# Patient Record
Sex: Male | Born: 1970 | Hispanic: No | Marital: Married | Smoking: Former smoker
Health system: Southern US, Community
[De-identification: ages and names within clinical notes are randomized; demographics above are authoritative.]

## PROBLEM LIST (undated history)

## (undated) DIAGNOSIS — Z21 Asymptomatic human immunodeficiency virus [HIV] infection status: Secondary | ICD-10-CM

## (undated) DIAGNOSIS — T7840XA Allergy, unspecified, initial encounter: Secondary | ICD-10-CM

## (undated) DIAGNOSIS — B20 Human immunodeficiency virus [HIV] disease: Secondary | ICD-10-CM

## (undated) HISTORY — DX: Human immunodeficiency virus (HIV) disease: B20

## (undated) HISTORY — DX: Allergy, unspecified, initial encounter: T78.40XA

## (undated) HISTORY — DX: Asymptomatic human immunodeficiency virus (hiv) infection status: Z21

---

## 2012-01-16 LAB — T-HELPER CELL (CD4) - (RCID CLINIC ONLY): CD4 absolute: 773

## 2012-01-18 LAB — CBC AND DIFFERENTIAL
HCT: 45 % (ref 41–53)
Platelets: 234 10*3/uL (ref 150–399)
WBC: 7 10^3/mL

## 2012-01-18 LAB — HEPATIC FUNCTION PANEL: ALT: 10 U/L (ref 10–40)

## 2012-01-18 LAB — BASIC METABOLIC PANEL
Creatinine: 0.8 mg/dL (ref 0.6–1.3)
Potassium: 4.1 mmol/L (ref 3.4–5.3)
Sodium: 139 mmol/L (ref 137–147)

## 2012-06-25 ENCOUNTER — Ambulatory Visit (INDEPENDENT_AMBULATORY_CARE_PROVIDER_SITE_OTHER): Payer: Medicaid Other

## 2012-06-25 ENCOUNTER — Other Ambulatory Visit (HOSPITAL_COMMUNITY)
Admission: RE | Admit: 2012-06-25 | Discharge: 2012-06-25 | Disposition: A | Discharge status: Home / Self Care | Payer: Medicaid Other | Source: Ambulatory Visit | Attending: Internal Medicine | Admitting: Internal Medicine

## 2012-06-25 DIAGNOSIS — B20 Human immunodeficiency virus [HIV] disease: Secondary | ICD-10-CM

## 2012-06-25 DIAGNOSIS — Z113 Encounter for screening for infections with a predominantly sexual mode of transmission: Secondary | ICD-10-CM | POA: Insufficient documentation

## 2012-06-25 DIAGNOSIS — F329 Major depressive disorder, single episode, unspecified: Secondary | ICD-10-CM

## 2012-06-25 DIAGNOSIS — N529 Male erectile dysfunction, unspecified: Secondary | ICD-10-CM | POA: Insufficient documentation

## 2012-06-25 DIAGNOSIS — R197 Diarrhea, unspecified: Secondary | ICD-10-CM

## 2012-06-25 DIAGNOSIS — F341 Dysthymic disorder: Secondary | ICD-10-CM

## 2012-06-25 LAB — CBC WITH DIFFERENTIAL/PLATELET
Basophils Absolute: 0 10*3/uL (ref 0.0–0.1)
HCT: 41.1 % (ref 39.0–52.0)
Lymphocytes Relative: 41 % (ref 12–46)
Monocytes Absolute: 0.6 10*3/uL (ref 0.1–1.0)
Neutro Abs: 4.4 10*3/uL (ref 1.7–7.7)
Neutrophils Relative %: 51 % (ref 43–77)
Platelets: 252 10*3/uL (ref 150–400)
RDW: 15 % (ref 11.5–15.5)
WBC: 8.6 10*3/uL (ref 4.0–10.5)

## 2012-06-26 LAB — COMPLETE METABOLIC PANEL WITH GFR
ALT: 9 U/L (ref 0–53)
AST: 12 U/L (ref 0–37)
Albumin: 4.2 g/dL (ref 3.5–5.2)
Alkaline Phosphatase: 107 U/L (ref 39–117)
Calcium: 9.5 mg/dL (ref 8.4–10.5)
Chloride: 108 mEq/L (ref 96–112)
Potassium: 4.2 mEq/L (ref 3.5–5.3)
Sodium: 143 mEq/L (ref 135–145)

## 2012-06-26 LAB — URINALYSIS
Nitrite: NEGATIVE
Protein, ur: NEGATIVE mg/dL
Specific Gravity, Urine: 1.028 (ref 1.005–1.030)
Urobilinogen, UA: 0.2 mg/dL (ref 0.0–1.0)

## 2012-06-26 LAB — LIPID PANEL
Cholesterol: 148 mg/dL (ref 0–200)
LDL Cholesterol: 91 mg/dL (ref 0–99)
Triglycerides: 63 mg/dL (ref <150)
VLDL: 13 mg/dL (ref 0–40)

## 2012-06-26 LAB — HEPATITIS C ANTIBODY: HCV Ab: NEGATIVE

## 2012-06-26 LAB — HEPATITIS B CORE ANTIBODY, TOTAL: Hep B Core Total Ab: POSITIVE — AB

## 2012-06-26 LAB — T-HELPER CELL (CD4) - (RCID CLINIC ONLY): CD4 % Helper T Cell: 33 % (ref 33–55)

## 2012-06-26 LAB — HEPATITIS A ANTIBODY, TOTAL: Hep A Total Ab: POSITIVE — AB

## 2012-07-04 NOTE — Progress Notes (Signed)
Pt reports unintentional weight loss of alt least 10 labs in the last 1-2 months. He gives history of continuous diarrhea. Some stools are total liquid and some are semi formed with a fluff consistency. He reports stool leakage throughout the day.  He is very concerned and anxious about these symptoms. He states having reported this to his previous ID physician and does not fel his issue was taken seriously.  His appetite is decreased and he is asking for ensure script.   Pt also experiencing erectile dysfunction.

## 2012-07-05 LAB — TB SKIN TEST
Induration: 0 mm
TB Skin Test: NEGATIVE

## 2012-07-10 ENCOUNTER — Ambulatory Visit (INDEPENDENT_AMBULATORY_CARE_PROVIDER_SITE_OTHER): Payer: Medicaid Other | Admitting: Internal Medicine

## 2012-07-10 ENCOUNTER — Encounter: Payer: Self-pay | Admitting: Internal Medicine

## 2012-07-10 VITALS — BP 106/74 | HR 72 | Temp 98.2°F | Wt 155.0 lb

## 2012-07-10 DIAGNOSIS — K529 Noninfective gastroenteritis and colitis, unspecified: Secondary | ICD-10-CM

## 2012-07-10 DIAGNOSIS — B20 Human immunodeficiency virus [HIV] disease: Secondary | ICD-10-CM

## 2012-07-10 DIAGNOSIS — R197 Diarrhea, unspecified: Secondary | ICD-10-CM

## 2012-07-10 DIAGNOSIS — Z21 Asymptomatic human immunodeficiency virus [HIV] infection status: Secondary | ICD-10-CM

## 2012-07-10 DIAGNOSIS — E46 Unspecified protein-calorie malnutrition: Secondary | ICD-10-CM

## 2012-07-10 DIAGNOSIS — E440 Moderate protein-calorie malnutrition: Secondary | ICD-10-CM

## 2012-07-10 MED ORDER — ENSURE PO LIQD: 237.0000 mL | Freq: Three times a day (TID) | ORAL | Status: DC
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
## 2012-07-11 ENCOUNTER — Other Ambulatory Visit: Payer: Medicaid Other

## 2012-07-15 DIAGNOSIS — B20 Human immunodeficiency virus [HIV] disease: Secondary | ICD-10-CM | POA: Insufficient documentation

## 2012-07-15 DIAGNOSIS — E46 Unspecified protein-calorie malnutrition: Secondary | ICD-10-CM | POA: Insufficient documentation

## 2012-07-15 LAB — STOOL CULTURE

## 2012-07-16 LAB — CLOSTRIDIUM DIFFICILE BY PCR: Toxigenic C. Difficile by PCR: NOT DETECTED

## 2012-07-24 ENCOUNTER — Ambulatory Visit (INDEPENDENT_AMBULATORY_CARE_PROVIDER_SITE_OTHER): Payer: Self-pay | Admitting: Internal Medicine

## 2012-07-24 ENCOUNTER — Encounter: Payer: Self-pay | Admitting: Internal Medicine

## 2012-07-24 VITALS — BP 111/73 | HR 72 | Temp 98.4°F | Ht 74.0 in | Wt 159.0 lb

## 2012-07-24 DIAGNOSIS — Z23 Encounter for immunization: Secondary | ICD-10-CM

## 2012-07-24 DIAGNOSIS — E46 Unspecified protein-calorie malnutrition: Secondary | ICD-10-CM

## 2012-07-24 DIAGNOSIS — Z21 Asymptomatic human immunodeficiency virus [HIV] infection status: Secondary | ICD-10-CM

## 2012-07-24 DIAGNOSIS — B20 Human immunodeficiency virus [HIV] disease: Secondary | ICD-10-CM

## 2012-07-24 MED ORDER — DRONABINOL 10 MG PO CAPS: 10.0000 mg | ORAL_CAPSULE | Freq: Two times a day (BID) | ORAL | Status: DC

## 2012-07-24 NOTE — Progress Notes (Signed)
HIV CLINIC NOTE  RFV: follow up appt Subjective:    Patient ID: Tyler Leonard, male    DOB: Apr 03, 1971, 41 y.o.   MRN: [id number]  HPI 41yo Male with CD 4 count 1080, VL < 20, on atripla, maintains great adherence. No missing doses. Comes to clinic to evaluate diarrhea and weight loss. Bowel movements now just twice a day . Eating snacks most of the day and eats on 1-2 meals per day. Drinks had 3 cans of ensure a day but ran out quickly since he only gets 1 case per week QOW[facility] from [facility]. 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 Current Outpatient Prescriptions on File Prior to Visit  Medication Sig Dispense Refill  . efavirenz-emtricitabine-tenofovir (ATRIPLA) 600-200-300 MG per tablet Take 1 tablet by mouth at bedtime.      . ENSURE (ENSURE) Take 237 mLs by mouth 3 (three) times daily between meals.  237 mL  11   Active Ambulatory Problems    Diagnosis Date Noted  . Diarrhea 06/25/2012  . Erectile dysfunction 06/25/2012  . HIV (human immunodeficiency virus infection) 07/15/2012  . Malnutrition 07/15/2012   Resolved Ambulatory Problems    Diagnosis Date Noted  . No Resolved Ambulatory Problems   Past Medical History  Diagnosis Date  . Allergy      Review of Systems  Constitutional:positive for weight loss. Negative for fever, chills, diaphoresis, activity change, appetite change, fatigue HENT: Negative for congestion, sore throat, rhinorrhea, sneezing, trouble swallowing and sinus pressure.  Eyes: Negative for photophobia and visual disturbance.  Respiratory: Negative for cough, chest tightness, shortness of breath, wheezing and stridor.  Cardiovascular: Negative for chest pain, palpitations and leg swelling.  Gastrointestinal: diarrhea. Negative for nausea, vomiting, abdominal pain,  constipation, blood in stool, abdominal distention and anal bleeding.  Genitourinary: Negative for dysuria, hematuria, flank  pain and difficulty urinating.  Musculoskeletal: Negative for myalgias, back pain, joint swelling, arthralgias and gait problem.  Skin: Negative for color change, pallor, rash and wound.  Neurological: Negative for dizziness, tremors, weakness and light-headedness.  Hematological: Negative for adenopathy. Does not bruise/bleed easily.  Psychiatric/Behavioral: Negative for behavioral problems, confusion, sleep disturbance, dysphoric mood, decreased concentration and agitation.       Objective:   Physical Exam  BP 111/73  Pulse 72  Temp 98.4 F (36.9 C) (Oral)  Ht 6\' 2"  (1.88 m)  Wt 159 lb (72.122 kg)  BMI 20.41 kg/m2 Physical Exam  Constitutional: He is oriented to person, place, and time. He appears well-developed and well-nourished. No distress.  HENT:  Mouth/Throat: Oropharynx is clear and moist. No oropharyngeal exudate.  Cardiovascular: Normal rate, regular rhythm and normal heart sounds. Exam reveals no gallop and no friction rub.  No murmur heard.  Pulmonary/Chest: Effort normal and breath sounds normal. No respiratory distress. He has no wheezes.  Abdominal: Soft. Bowel sounds are normal. He exhibits no distension. There is no tenderness.  Lymphadenopathy:  He has no cervical adenopathy.  Neurological: He is alert and oriented to person, place, and time.  Skin: Skin is warm and dry. No rash noted. No erythema.  Psychiatric: He has a normal mood and affect. His behavior is normal.         Assessment & Plan:  Diarrhea = seems ok since on 1-2 BM per day. Will do PRN immodium  Malnutrition = weight loss, not a baseline, but he has gained 4 lb since last visit, getting nutritional supplements, plus  we will add appetit stimulant = to help to increase weight gain. Will try marino 10mg  BID for now.  Health maintenance =received flu vax. He is already Immunized ag. hav and hbv  rtc 4-6 wks

## 2012-08-29 ENCOUNTER — Ambulatory Visit (INDEPENDENT_AMBULATORY_CARE_PROVIDER_SITE_OTHER): Payer: Self-pay | Admitting: Internal Medicine

## 2012-08-29 ENCOUNTER — Encounter: Payer: Self-pay | Admitting: Internal Medicine

## 2012-08-29 VITALS — BP 113/70 | HR 80 | Temp 97.7°F | Wt 161.0 lb

## 2012-08-29 DIAGNOSIS — N529 Male erectile dysfunction, unspecified: Secondary | ICD-10-CM

## 2012-08-29 MED ORDER — SILDENAFIL CITRATE 100 MG PO TABS: 100.0000 mg | ORAL_TABLET | Freq: Every day | ORAL | Status: DC | PRN
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
## 2012-11-19 ENCOUNTER — Other Ambulatory Visit: Payer: Self-pay | Admitting: *Deleted

## 2012-11-19 DIAGNOSIS — N529 Male erectile dysfunction, unspecified: Secondary | ICD-10-CM

## 2012-11-19 DIAGNOSIS — F329 Major depressive disorder, single episode, unspecified: Secondary | ICD-10-CM

## 2012-11-19 DIAGNOSIS — B20 Human immunodeficiency virus [HIV] disease: Secondary | ICD-10-CM

## 2012-11-19 DIAGNOSIS — R197 Diarrhea, unspecified: Secondary | ICD-10-CM

## 2012-11-19 MED ORDER — EFAVIRENZ-EMTRICITAB-TENOFOVIR 600-200-300 MG PO TABS: 1.0000 | ORAL_TABLET | Freq: Every day | ORAL | Status: DC

## 2012-11-26 ENCOUNTER — Other Ambulatory Visit: Payer: Self-pay | Admitting: *Deleted

## 2012-11-26 DIAGNOSIS — N529 Male erectile dysfunction, unspecified: Secondary | ICD-10-CM

## 2012-11-26 DIAGNOSIS — R197 Diarrhea, unspecified: Secondary | ICD-10-CM

## 2012-11-26 DIAGNOSIS — F419 Anxiety disorder, unspecified: Secondary | ICD-10-CM

## 2012-11-26 DIAGNOSIS — B20 Human immunodeficiency virus [HIV] disease: Secondary | ICD-10-CM

## 2012-11-26 MED ORDER — EFAVIRENZ-EMTRICITAB-TENOFOVIR 600-200-300 MG PO TABS: 1.0000 | ORAL_TABLET | Freq: Every day | ORAL | Status: DC

## 2012-11-28 ENCOUNTER — Other Ambulatory Visit (INDEPENDENT_AMBULATORY_CARE_PROVIDER_SITE_OTHER): Payer: Self-pay

## 2012-11-28 DIAGNOSIS — B20 Human immunodeficiency virus [HIV] disease: Secondary | ICD-10-CM

## 2012-11-28 LAB — CBC WITH DIFFERENTIAL/PLATELET
Eosinophils Absolute: 0.1 10*3/uL (ref 0.0–0.7)
Eosinophils Relative: 1 % (ref 0–5)
Hemoglobin: 13.5 g/dL (ref 13.0–17.0)
Lymphs Abs: 2.8 10*3/uL (ref 0.7–4.0)
MCH: 28.9 pg (ref 26.0–34.0)
MCV: 84.6 fL (ref 78.0–100.0)
Monocytes Absolute: 0.4 10*3/uL (ref 0.1–1.0)
Monocytes Relative: 7 % (ref 3–12)
RBC: 4.67 MIL/uL (ref 4.22–5.81)

## 2012-11-28 LAB — COMPLETE METABOLIC PANEL WITH GFR
CO2: 26 mEq/L (ref 19–32)
Creat: 0.73 mg/dL (ref 0.50–1.35)
GFR, Est African American: >89 mL/min
GFR, Est Non African American: >89 mL/min
Glucose, Bld: 72 mg/dL (ref 70–99)
Total Bilirubin: 0.3 mg/dL (ref 0.3–1.2)
Total Protein: 6.4 g/dL (ref 6.0–8.3)

## 2012-12-12 ENCOUNTER — Ambulatory Visit (INDEPENDENT_AMBULATORY_CARE_PROVIDER_SITE_OTHER): Payer: Self-pay | Admitting: Internal Medicine

## 2012-12-12 ENCOUNTER — Encounter: Payer: Self-pay | Admitting: Internal Medicine

## 2012-12-12 VITALS — BP 116/69 | HR 72 | Temp 98.0°F | Wt 174.0 lb

## 2012-12-12 DIAGNOSIS — Z21 Asymptomatic human immunodeficiency virus [HIV] infection status: Secondary | ICD-10-CM

## 2012-12-12 DIAGNOSIS — E440 Moderate protein-calorie malnutrition: Secondary | ICD-10-CM

## 2012-12-12 DIAGNOSIS — B20 Human immunodeficiency virus [HIV] disease: Secondary | ICD-10-CM

## 2012-12-12 MED ORDER — ELVITEG-COBIC-EMTRICIT-TENOFDF 150-150-200-300 MG PO TABS: 1.0000 | ORAL_TABLET | Freq: Every day | ORAL | Status: DC

## 2012-12-12 MED ORDER — ENSURE PO LIQD: 1.0000 | Freq: Two times a day (BID) | ORAL | Status: AC

## 2012-12-12 NOTE — Progress Notes (Signed)
[facility] HIV CLINIC NOTE  RFV: routine follow up Subjective:    Patient ID: Tyler Leonard, male    DOB: April 22, 1971, 42 y.o.   MRN: [id number]  HPI Tyler Leonard is a 42yo Male, MSM, with HIV disease, CD 4 count of 810/VL<20,on atripla, doing well. Still concerned about facial wasting and weight loss, although he has gained 14# since his last visit with use of ensure and marinol. He is having some difficulty with nasal congestion vs. Teeth pain to right frontal area.   He has quiet smoking. He reports having flu like symptoms, but not receiving oseltamivir, did not seek care just did over the counter meds.  Started seeing psychiatrist risperidone 1mg  at bedtime and prazosin 1mg  at bedtime. Has not been sleeping well, feeling fatigue.   Current Outpatient Prescriptions on File Prior to Visit  Medication Sig Dispense Refill  . dronabinol (MARINOL) 10 MG capsule Take 1 capsule (10 mg total) by mouth 2 (two) times daily before a meal.  60 capsule  5  . efavirenz-emtricitabine-tenofovir (ATRIPLA) 600-200-300 MG per tablet Take 1 tablet by mouth at bedtime.  30 tablet  11  . sildenafil (VIAGRA) 100 MG tablet Take 1 tablet (100 mg total) by mouth daily as needed for erectile dysfunction.  10 tablet  6   Active Ambulatory Problems    Diagnosis Date Noted  . Diarrhea 06/25/2012  . Erectile dysfunction 06/25/2012  . HIV (human immunodeficiency virus infection) 07/15/2012  . Malnutrition 07/15/2012   Resolved Ambulatory Problems    Diagnosis Date Noted  . No Resolved Ambulatory Problems   Past Medical History  Diagnosis Date  . Allergy    Social hx: started in a relationship     Review of Systems     Objective:   Physical Exam BP 116/69  Pulse 72  Temp 98 F (36.7 C) (Oral)  Wt 174 lb (78.926 kg) ( up by 14# since last visit) Physical Exam  Constitutional: He is oriented to person, place, and time. He appears well-developed and well-nourished. No distress.  HENT:  Mouth/Throat: Oropharynx  is clear and moist. No oropharyngeal exudate.  Cardiovascular: Normal rate, regular rhythm and normal heart sounds. Exam reveals no gallop and no friction rub.  No murmur heard.  Pulmonary/Chest: Effort normal and breath sounds normal. No respiratory distress. He has no wheezes.  Abdominal: Soft. Bowel sounds are normal. He exhibits no distension. There is no tenderness.  Lymphadenopathy:  He has no cervical adenopathy.  Neurological: He is alert and oriented to person, place, and time.  Skin: Skin is warm and dry. No rash noted. No erythema.  Psychiatric: He has a normal mood and affect. His behavior is normal.       Assessment & Plan:  HIV = will switch to stribild due to sleep disturbances and fatigue.  Health maintenance = will do pneumovax at next visit in 3 months  Erectile dysfunction = recommend to decrease usage to 50mg  instead of 100mg  due to drug interaction with stribild, increasing levels.  rtc in 3 months for routine visit

## 2013-02-11 ENCOUNTER — Encounter: Payer: Self-pay | Admitting: *Deleted

## 2013-02-25 ENCOUNTER — Other Ambulatory Visit: Payer: Self-pay | Admitting: Infectious Diseases

## 2013-02-25 ENCOUNTER — Other Ambulatory Visit (INDEPENDENT_AMBULATORY_CARE_PROVIDER_SITE_OTHER): Payer: Self-pay

## 2013-02-25 DIAGNOSIS — B20 Human immunodeficiency virus [HIV] disease: Secondary | ICD-10-CM

## 2013-02-25 LAB — COMPREHENSIVE METABOLIC PANEL
ALT: 11 U/L (ref 0–53)
AST: 14 U/L (ref 0–37)
Albumin: 4.3 g/dL (ref 3.5–5.2)
Alkaline Phosphatase: 67 U/L (ref 39–117)
BUN: 15 mg/dL (ref 6–23)
Potassium: 4.2 mEq/L (ref 3.5–5.3)
Sodium: 139 mEq/L (ref 135–145)

## 2013-02-25 LAB — CBC WITH DIFFERENTIAL/PLATELET
Basophils Absolute: 0 10*3/uL (ref 0.0–0.1)
Basophils Relative: 0 % (ref 0–1)
MCHC: 32.7 g/dL (ref 30.0–36.0)
Monocytes Absolute: 0.5 10*3/uL (ref 0.1–1.0)
Neutro Abs: 2.6 10*3/uL (ref 1.7–7.7)
Neutrophils Relative %: 42 % — ABNORMAL LOW (ref 43–77)
Platelets: 223 10*3/uL (ref 150–400)
RDW: 13.7 % (ref 11.5–15.5)
WBC: 6.2 10*3/uL (ref 4.0–10.5)

## 2013-02-26 LAB — T-HELPER CELL (CD4) - (RCID CLINIC ONLY): CD4 T Cell Abs: 750 uL (ref 400–2700)

## 2013-03-11 ENCOUNTER — Ambulatory Visit (INDEPENDENT_AMBULATORY_CARE_PROVIDER_SITE_OTHER): Payer: Self-pay | Admitting: Internal Medicine

## 2013-03-11 ENCOUNTER — Encounter: Payer: Self-pay | Admitting: Internal Medicine

## 2013-03-11 VITALS — BP 107/72 | HR 78 | Temp 97.6°F | Wt 181.0 lb

## 2013-03-11 DIAGNOSIS — N529 Male erectile dysfunction, unspecified: Secondary | ICD-10-CM

## 2013-03-11 MED ORDER — TADALAFIL 10 MG PO TABS: 10.0000 mg | ORAL_TABLET | Freq: Every day | ORAL | Status: DC | PRN

## 2013-03-11 NOTE — Progress Notes (Signed)
[facility] HIV CLINIC NOTE  RFV: routine Subjective:    Patient ID: Tyler Leonard, male    DOB: Jul 21, 1971, 42 y.o.   MRN: [id number]  HPI CD 4 count of 750/VL < 20, on stribild, doing well.not missing doses.  Having difficulty to maintain erection. Has tried up to 100mg  viagra.   Also taking prazosin 2mg  daily Risperidone 3mg  dialy  Current Outpatient Prescriptions on File Prior to Visit  Medication Sig Dispense Refill  . elvitegravir-cobicistat-emtricitabine-tenofovir (STRIBILD) 150-150-200-300 MG TABS Take 1 tablet by mouth daily with breakfast.  30 tablet  11  . ENSURE (ENSURE) Take 1 Can by mouth 2 (two) times daily between meals.  237 mL  11  . sildenafil (VIAGRA) 100 MG tablet Take 1 tablet (100 mg total) by mouth daily as needed for erectile dysfunction.  10 tablet  6  . dronabinol (MARINOL) 10 MG capsule Take 1 capsule (10 mg total) by mouth 2 (two) times daily before a meal.  60 capsule  5   No current facility-administered medications on file prior to visit.   Active Ambulatory Problems    Diagnosis Date Noted  . Diarrhea 06/25/2012  . Erectile dysfunction 06/25/2012  . HIV (human immunodeficiency virus infection) 07/15/2012  . Malnutrition 07/15/2012   Resolved Ambulatory Problems    Diagnosis Date Noted  . No Resolved Ambulatory Problems   Past Medical History  Diagnosis Date  . Allergy        Review of Systems 10 point of review of system negative except for hpi    Objective:   Physical Exam BP 107/72  Pulse 78  Temp(Src) 97.6 F (36.4 C) (Oral)  Wt 181 lb (82.101 kg)  BMI 23.23 kg/m2 Physical Exam  Constitutional: He is oriented to person, place, and time. He appears well-developed and well-nourished. No distress.  HENT:  Mouth/Throat: Oropharynx is clear and moist. No oropharyngeal exudate.  Cardiovascular: Normal rate, regular rhythm and normal heart sounds. Exam reveals no gallop and no friction rub.  No murmur heard.  Pulmonary/Chest: Effort  normal and breath sounds normal. No respiratory distress. He has no wheezes.  Abdominal: Soft. Bowel sounds are normal. He exhibits no distension. There is no tenderness.  Lymphadenopathy:  He has no cervical adenopathy.  Neurological: He is alert and oriented to person, place, and time.  Skin: Skin is warm and dry. No rash noted. No erythema.  Psychiatric: He has a normal mood and affect. His behavior is normal.       Assessment & Plan:  HIV = continue with hiv meds  Depression = continue with meds per psychiatrist. Discuss that psych meds may also be contributing to lack of sexual drive  Erectile dysfunction = can do trial of cialis but may not have any difference

## 2013-04-10 NOTE — Progress Notes (Signed)
Patient ID: Tyler Leonard, male   DOB: 12/11/1970, 42 y.o.   MRN: [id number] [facility] CM: Neila Gear

## 2013-05-15 NOTE — Progress Notes (Signed)
Patient ID: Tyler Leonard, male   DOB: Apr 04, 1971, 42 y.o.   MRN: [id number] [facility] CM discharged. Client moved to [place].

## 2013-06-20 ENCOUNTER — Encounter: Payer: Self-pay | Admitting: *Deleted

## 2013-06-20 NOTE — Progress Notes (Signed)
Patient ID: Tyler Leonard, male   DOB: September 06, 1971, 42 y.o.   MRN: [id number] Relocated to [facility].

## 2013-07-14 ENCOUNTER — Ambulatory Visit: Payer: Self-pay | Admitting: Internal Medicine

## 2013-09-11 ENCOUNTER — Other Ambulatory Visit: Payer: Self-pay

## 2013-09-22 ENCOUNTER — Other Ambulatory Visit (INDEPENDENT_AMBULATORY_CARE_PROVIDER_SITE_OTHER): Payer: Self-pay

## 2013-09-22 DIAGNOSIS — B20 Human immunodeficiency virus [HIV] disease: Secondary | ICD-10-CM

## 2013-09-22 LAB — CBC WITH DIFFERENTIAL/PLATELET
Basophils Relative: 0 % (ref 0–1)
Eosinophils Absolute: 0 10*3/uL (ref 0.0–0.7)
HCT: 46.3 % (ref 39.0–52.0)
Hemoglobin: 15.8 g/dL (ref 13.0–17.0)
Lymphs Abs: 2.3 10*3/uL (ref 0.7–4.0)
MCH: 27.7 pg (ref 26.0–34.0)
MCHC: 34.1 g/dL (ref 30.0–36.0)
Monocytes Absolute: 0.4 10*3/uL (ref 0.1–1.0)
Monocytes Relative: 5 % (ref 3–12)
Neutro Abs: 4 10*3/uL (ref 1.7–7.7)
RBC: 5.7 MIL/uL (ref 4.22–5.81)

## 2013-09-22 LAB — COMPREHENSIVE METABOLIC PANEL
ALT: <8 U/L (ref 0–53)
AST: 17 U/L (ref 0–37)
Albumin: 4.7 g/dL (ref 3.5–5.2)
CO2: 30 mEq/L (ref 19–32)
Calcium: 10 mg/dL (ref 8.4–10.5)
Chloride: 98 mEq/L (ref 96–112)
Creat: 1.12 mg/dL (ref 0.50–1.35)
Potassium: 3.9 mEq/L (ref 3.5–5.3)

## 2013-09-22 NOTE — Addendum Note (Signed)
Addended bySteva Colder on: 09/22/2013 04:07 PM   Modules accepted: Orders

## 2013-09-23 LAB — T-HELPER CELL (CD4) - (RCID CLINIC ONLY): CD4 T Cell Abs: 630 /uL (ref 400–2700)

## 2013-09-24 LAB — HIV-1 RNA QUANT-NO REFLEX-BLD: HIV-1 RNA Quant, Log: <1.3 {Log} (ref <1.30)

## 2013-10-09 ENCOUNTER — Ambulatory Visit (INDEPENDENT_AMBULATORY_CARE_PROVIDER_SITE_OTHER): Payer: Self-pay | Admitting: Internal Medicine

## 2013-10-09 ENCOUNTER — Encounter: Payer: Self-pay | Admitting: Internal Medicine

## 2013-10-09 VITALS — BP 111/66 | HR 71 | Temp 97.8°F | Wt 168.0 lb

## 2013-10-09 DIAGNOSIS — B20 Human immunodeficiency virus [HIV] disease: Secondary | ICD-10-CM

## 2013-10-09 DIAGNOSIS — Z21 Asymptomatic human immunodeficiency virus [HIV] infection status: Secondary | ICD-10-CM

## 2013-10-09 MED ORDER — ELVITEG-COBIC-EMTRICIT-TENOFDF 150-150-200-300 MG PO TABS: 1.0000 | ORAL_TABLET | Freq: Every day | ORAL | Status: DC

## 2013-10-09 MED ORDER — ANDROGEL PUMP 20.25 MG/ACT (1.62%) TD GEL: 40.5000 mg | Freq: Two times a day (BID) | TRANSDERMAL | Status: DC

## 2013-10-09 NOTE — Progress Notes (Signed)
Subjective:    Patient ID: Tyler Leonard, male    DOB: 03/07/1971, 42 y.o.   MRN: [id number]  HPI  42 yo M with HIV, CD 4 count 630/VL<20, on stribild. continues having good adherence. He tried going to clinic in [place] for  Follow up but now decided to come back to [facility]. He was last seen in May. He is here with his significant other. Still having erectile dysfunction but improved with testoterone supplementation. He notices increased in mood, less fatigue, less hot flashes due To starting androgel since mid august. He is using it more than directed 2 pumps BID rather 1 pump daily since no difference from taking it for  One month.   Labs from [facility]: Free t 2.8 and total T 220 . tsh 1.04. In [place] in mid July Cd 4 count 1000s  No Known Allergies   Current Outpatient Prescriptions on File Prior to Visit  Medication Sig Dispense Refill  . elvitegravir-cobicistat-emtricitabine-tenofovir (STRIBILD) 150-150-200-300 MG TABS Take 1 tablet by mouth daily with breakfast.  30 tablet  11  . dronabinol (MARINOL) 10 MG capsule Take 1 capsule (10 mg total) by mouth 2 (two) times daily before a meal.  60 capsule  5  . ENSURE (ENSURE) Take 1 Can by mouth 2 (two) times daily between meals.  237 mL  11  . tadalafil (CIALIS) 10 MG tablet Take 1 tablet (10 mg total) by mouth daily as needed for erectile dysfunction.  20 tablet  0   No current facility-administered medications on file prior to visit.   Active Ambulatory Problems    Diagnosis Date Noted  . Diarrhea 06/25/2012  . Erectile dysfunction 06/25/2012  . HIV (human immunodeficiency virus infection) 07/15/2012  . Malnutrition 07/15/2012   Resolved Ambulatory Problems    Diagnosis Date Noted  . No Resolved Ambulatory Problems   Past Medical History  Diagnosis Date  . Allergy    Soc hx: relocating back to [place] to be back with family's   Review of Systems  Constitutional: Negative for fever, chills, diaphoresis,  activity change, appetite change, fatigue and unexpected weight change.  HENT: Negative for congestion, sore throat, rhinorrhea, sneezing, trouble swallowing and sinus pressure.  Eyes: Negative for photophobia and visual disturbance.  Respiratory: Negative for cough, chest tightness, shortness of breath, wheezing and stridor.  Cardiovascular: Negative for chest pain, palpitations and leg swelling.  Gastrointestinal: Negative for nausea, vomiting, abdominal pain, diarrhea, constipation, blood in stool, abdominal distention and anal bleeding.  Genitourinary: Negative for dysuria, hematuria, flank pain and difficulty urinating.  Musculoskeletal: Negative for myalgias, back pain, joint swelling, arthralgias and gait problem.  Skin: Negative for color change, pallor, rash and wound.  Neurological: Negative for dizziness, tremors, weakness and light-headedness.  Hematological: Negative for adenopathy. Does not bruise/bleed easily.  Psychiatric/Behavioral: Negative for behavioral problems, confusion, sleep disturbance, dysphoric mood, decreased concentration and agitation.       Objective:   Physical Exam BP 111/66  Pulse 71  Temp(Src) 97.8 F (36.6 C) (Oral)  Wt 168 lb (76.204 kg) Constitutional: He is oriented to person, place, and time. He appears well-developed and well-nourished. No distress.  HENT:  Mouth/Throat: Oropharynx is clear and moist. No oropharyngeal exudate.  Cardiovascular: Normal rate, regular rhythm and normal heart sounds. Exam reveals no gallop and no friction rub.  No murmur heard.  Pulmonary/Chest: Effort normal and breath sounds normal. No respiratory distress. He has no wheezes.  Abdominal: Soft. Bowel sounds are normal. He exhibits no distension.  There is no tenderness.  Lymphadenopathy:  He has no cervical adenopathy.  Neurological: He is alert and oriented to person, place, and time.  Skin: Skin is warm and dry. No rash noted. No erythema.  Psychiatric: He has a  normal mood and affect. His behavior is normal.          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 Will anticipate to decrease to 1 pump BID at next visit to See if he notices any difference on lower dose.   Health maintenance = gave flu today  rtc in 3 months, labs 2 wks prior

## 2013-11-19 ENCOUNTER — Encounter: Payer: Self-pay | Admitting: *Deleted

## 2013-12-25 ENCOUNTER — Other Ambulatory Visit (INDEPENDENT_AMBULATORY_CARE_PROVIDER_SITE_OTHER): Payer: Self-pay

## 2013-12-25 DIAGNOSIS — Z79899 Other long term (current) drug therapy: Secondary | ICD-10-CM

## 2013-12-25 DIAGNOSIS — Z113 Encounter for screening for infections with a predominantly sexual mode of transmission: Secondary | ICD-10-CM

## 2013-12-25 DIAGNOSIS — B20 Human immunodeficiency virus [HIV] disease: Secondary | ICD-10-CM

## 2013-12-25 LAB — COMPLETE METABOLIC PANEL WITH GFR
ALBUMIN: 4.3 g/dL (ref 3.5–5.2)
ALT: <8 U/L (ref 0–53)
AST: 12 U/L (ref 0–37)
Alkaline Phosphatase: 80 U/L (ref 39–117)
BILIRUBIN TOTAL: 0.3 mg/dL (ref 0.2–1.2)
BUN: 14 mg/dL (ref 6–23)
CO2: 28 mEq/L (ref 19–32)
Calcium: 9 mg/dL (ref 8.4–10.5)
Chloride: 103 mEq/L (ref 96–112)
Creat: 1.02 mg/dL (ref 0.50–1.35)
GFR, Est African American: >89 mL/min
GFR, Est Non African American: 89 mL/min
Glucose, Bld: 83 mg/dL (ref 70–99)
POTASSIUM: 3.8 meq/L (ref 3.5–5.3)
SODIUM: 140 meq/L (ref 135–145)
Total Protein: 6.3 g/dL (ref 6.0–8.3)

## 2013-12-25 LAB — CBC WITH DIFFERENTIAL/PLATELET
BASOS ABS: 0 10*3/uL (ref 0.0–0.1)
BASOS PCT: 0 % (ref 0–1)
EOS ABS: 0 10*3/uL (ref 0.0–0.7)
Eosinophils Relative: 0 % (ref 0–5)
HCT: 40.4 % (ref 39.0–52.0)
Hemoglobin: 13.6 g/dL (ref 13.0–17.0)
Lymphocytes Relative: 48 % — ABNORMAL HIGH (ref 12–46)
Lymphs Abs: 2.4 10*3/uL (ref 0.7–4.0)
MCH: 27.3 pg (ref 26.0–34.0)
MCHC: 33.7 g/dL (ref 30.0–36.0)
MCV: 81.1 fL (ref 78.0–100.0)
Monocytes Absolute: 0.4 10*3/uL (ref 0.1–1.0)
Monocytes Relative: 7 % (ref 3–12)
NEUTROS ABS: 2.3 10*3/uL (ref 1.7–7.7)
NEUTROS PCT: 45 % (ref 43–77)
Platelets: 244 10*3/uL (ref 150–400)
RBC: 4.98 MIL/uL (ref 4.22–5.81)
RDW: 14.5 % (ref 11.5–15.5)
WBC: 5.1 10*3/uL (ref 4.0–10.5)

## 2013-12-25 LAB — LIPID PANEL
CHOL/HDL RATIO: 3.4 ratio
Cholesterol: 164 mg/dL (ref 0–200)
HDL: 48 mg/dL (ref >39)
LDL Cholesterol: 98 mg/dL (ref 0–99)
Triglycerides: 89 mg/dL (ref <150)
VLDL: 18 mg/dL (ref 0–40)

## 2013-12-25 LAB — RPR

## 2013-12-25 NOTE — Addendum Note (Signed)
Addended bySteva Colder: Zacharius Funari on: 12/25/2013 10:25 AM   Modules accepted: Orders

## 2013-12-26 LAB — URINE CYTOLOGY ANCILLARY ONLY
CHLAMYDIA, DNA PROBE: NEGATIVE
Neisseria Gonorrhea: NEGATIVE

## 2013-12-26 LAB — T-HELPER CELL (CD4) - (RCID CLINIC ONLY)
CD4 % Helper T Cell: 27 % — ABNORMAL LOW (ref 33–55)
CD4 T Cell Abs: 680 /uL (ref 400–2700)

## 2013-12-27 LAB — HIV-1 RNA QUANT-NO REFLEX-BLD
HIV 1 RNA Quant: <20 copies/mL (ref <20)
HIV-1 RNA Quant, Log: <1.3 {Log} (ref <1.30)

## 2014-01-08 ENCOUNTER — Ambulatory Visit (INDEPENDENT_AMBULATORY_CARE_PROVIDER_SITE_OTHER): Payer: Self-pay | Admitting: Internal Medicine

## 2014-01-08 ENCOUNTER — Encounter: Payer: Self-pay | Admitting: Internal Medicine

## 2014-01-08 VITALS — BP 102/62 | HR 86 | Temp 98.2°F | Wt 169.0 lb

## 2014-01-08 DIAGNOSIS — B20 Human immunodeficiency virus [HIV] disease: Secondary | ICD-10-CM

## 2014-01-08 DIAGNOSIS — Z23 Encounter for immunization: Secondary | ICD-10-CM

## 2014-01-08 NOTE — Progress Notes (Signed)
   Subjective:    Patient ID: Tyler Leonard, male    DOB: 12/22/1970, 43 y.o.   MRN: [id number]  HPI Tyler Leonard 43yo M with HIV, CD 4 count of 680/VL<20, on stribild. He reports doing well on meds. Not missing any doses. Denies any recent illness. The best he has felt in a long time.  Current Outpatient Prescriptions on File Prior to Visit  Medication Sig Dispense Refill  . ANDROGEL PUMP 20.25 MG/ACT (1.62%) GEL Place 40.5 mg onto the skin 2 (two) times daily. ( equivalent of 2 pumps twice a day). Dispense quantity sufficient  75 g  5  . elvitegravir-cobicistat-emtricitabine-tenofovir (STRIBILD) 150-150-200-300 MG TABS tablet Take 1 tablet by mouth daily with breakfast.  30 tablet  11  . tadalafil (CIALIS) 10 MG tablet Take 1 tablet (10 mg total) by mouth daily as needed for erectile dysfunction.  20 tablet  0   No current facility-administered medications on file prior to visit.   Active Ambulatory Problems    Diagnosis Date Noted  . Diarrhea 06/25/2012  . Erectile dysfunction 06/25/2012  . HIV (human immunodeficiency virus infection) 07/15/2012  . Malnutrition 07/15/2012   Resolved Ambulatory Problems    Diagnosis Date Noted  . No Resolved Ambulatory Problems   Past Medical History  Diagnosis Date  . Allergy        Review of Systems Review of Systems  Constitutional: Negative for fever, chills, diaphoresis, activity change, appetite change, fatigue and unexpected weight change.  HENT: Negative for congestion, sore throat, rhinorrhea, sneezing, trouble swallowing and sinus pressure.  Eyes: Negative for photophobia and visual disturbance.  Respiratory: Negative for cough, chest tightness, shortness of breath, wheezing and stridor.  Cardiovascular: Negative for chest pain, palpitations and leg swelling.  Gastrointestinal: Negative for nausea, vomiting, abdominal pain, diarrhea, constipation, blood in stool, abdominal distention and anal bleeding.  Genitourinary: Negative for  dysuria, hematuria, flank pain and difficulty urinating.  Musculoskeletal: Negative for myalgias, back pain, joint swelling, arthralgias and gait problem.  Skin: Negative for color change, pallor, rash and wound.  Neurological: Negative for dizziness, tremors, weakness and light-headedness.  Hematological: Negative for adenopathy. Does not bruise/bleed easily.  Psychiatric/Behavioral: Negative for behavioral problems, confusion, sleep disturbance, dysphoric mood, decreased concentration and agitation.       Objective:   Physical Exam BP 102/62  Pulse 86  Temp(Src) 98.2 F (36.8 C) (Oral)  Wt 169 lb (76.658 kg) Physical Exam  Constitutional: He is oriented to person, place, and time. He appears well-developed and well-nourished. No distress.  HENT:  Mouth/Throat: Oropharynx is clear and moist. No oropharyngeal exudate.  Cardiovascular: Normal rate, regular rhythm and normal heart sounds. Exam reveals no gallop and no friction rub.  No murmur heard.  Pulmonary/Chest: Effort normal and breath sounds normal. No respiratory distress. He has no wheezes.  Abdominal: Soft. Bowel sounds are normal. He exhibits no distension. There is no tenderness.  Lymphadenopathy:  He has no cervical adenopathy.  Neurological: He is alert and oriented to person, place, and time.  Skin: Skin is warm and dry. No rash noted. No erythema.  Psychiatric: He has a normal mood and affect. His behavior is normal.          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 Feeling good  rtc in 3 months

## 2014-01-15 ENCOUNTER — Other Ambulatory Visit: Payer: Self-pay | Admitting: *Deleted

## 2014-01-15 ENCOUNTER — Telehealth: Payer: Self-pay | Admitting: *Deleted

## 2014-01-15 NOTE — Telephone Encounter (Signed)
[employer] approved authorization approved # [id number]

## 2014-03-26 ENCOUNTER — Other Ambulatory Visit (INDEPENDENT_AMBULATORY_CARE_PROVIDER_SITE_OTHER): Payer: Self-pay

## 2014-03-26 DIAGNOSIS — B20 Human immunodeficiency virus [HIV] disease: Secondary | ICD-10-CM

## 2014-03-26 LAB — CBC WITH DIFFERENTIAL/PLATELET
BASOS ABS: 0.1 10*3/uL (ref 0.0–0.1)
Basophils Relative: 1 % (ref 0–1)
EOS PCT: 1 % (ref 0–5)
Eosinophils Absolute: 0.1 10*3/uL (ref 0.0–0.7)
HEMATOCRIT: 43.7 % (ref 39.0–52.0)
Hemoglobin: 14.5 g/dL (ref 13.0–17.0)
Lymphocytes Relative: 56 % — ABNORMAL HIGH (ref 12–46)
Lymphs Abs: 3.5 10*3/uL (ref 0.7–4.0)
MCH: 27.7 pg (ref 26.0–34.0)
MCHC: 33.2 g/dL (ref 30.0–36.0)
MCV: 83.4 fL (ref 78.0–100.0)
MONO ABS: 0.4 10*3/uL (ref 0.1–1.0)
Monocytes Relative: 7 % (ref 3–12)
Neutro Abs: 2.2 10*3/uL (ref 1.7–7.7)
Neutrophils Relative %: 35 % — ABNORMAL LOW (ref 43–77)
Platelets: 252 10*3/uL (ref 150–400)
RBC: 5.24 MIL/uL (ref 4.22–5.81)
RDW: 14.3 % (ref 11.5–15.5)
WBC: 6.2 10*3/uL (ref 4.0–10.5)

## 2014-03-26 LAB — COMPREHENSIVE METABOLIC PANEL
ALBUMIN: 4.2 g/dL (ref 3.5–5.2)
AST: 13 U/L (ref 0–37)
Alkaline Phosphatase: 95 U/L (ref 39–117)
BUN: 12 mg/dL (ref 6–23)
CALCIUM: 9.3 mg/dL (ref 8.4–10.5)
CHLORIDE: 103 meq/L (ref 96–112)
CO2: 29 meq/L (ref 19–32)
Creat: 1.03 mg/dL (ref 0.50–1.35)
Glucose, Bld: 76 mg/dL (ref 70–99)
POTASSIUM: 3.8 meq/L (ref 3.5–5.3)
SODIUM: 139 meq/L (ref 135–145)
Total Bilirubin: 0.4 mg/dL (ref 0.2–1.2)
Total Protein: 6.8 g/dL (ref 6.0–8.3)

## 2014-03-27 LAB — HIV-1 RNA QUANT-NO REFLEX-BLD
HIV 1 RNA Quant: 45 copies/mL — ABNORMAL HIGH (ref <20)
HIV-1 RNA Quant, Log: 1.65 {Log} — ABNORMAL HIGH (ref <1.30)

## 2014-03-27 LAB — T-HELPER CELL (CD4) - (RCID CLINIC ONLY)
CD4 % Helper T Cell: 32 % — ABNORMAL LOW (ref 33–55)
CD4 T Cell Abs: 1170 /uL (ref 400–2700)

## 2014-04-07 ENCOUNTER — Encounter: Payer: Self-pay | Admitting: Internal Medicine

## 2014-04-07 ENCOUNTER — Ambulatory Visit (INDEPENDENT_AMBULATORY_CARE_PROVIDER_SITE_OTHER): Payer: Self-pay | Admitting: Internal Medicine

## 2014-04-07 VITALS — BP 114/77 | HR 56 | Temp 98.9°F | Wt 169.0 lb

## 2014-04-07 DIAGNOSIS — G4700 Insomnia, unspecified: Secondary | ICD-10-CM

## 2014-04-07 DIAGNOSIS — B20 Human immunodeficiency virus [HIV] disease: Secondary | ICD-10-CM

## 2014-04-07 NOTE — Progress Notes (Signed)
   Subjective:    Patient ID: Tyler Leonard, male    DOB: 28-Feb-1971, 43 y.o.   MRN: [id number]  HPI 43yo M with HIV, CD 4 count of 1170/VL 45, on stribild. Not missing a dose of stribild. He is doing well. No complaints with his health other than he is noticing some nights where he has difficulty sleeping. In the past has tried trazadone, respiradone (extra pyramidal effects?), he currently infrequently takes his partner's xanax for sleep. Otherwise, no health complaints  No Known Allergies  Current Outpatient Prescriptions on File Prior to Visit  Medication Sig Dispense Refill  . ANDROGEL PUMP 20.25 MG/ACT (1.62%) GEL Place 40.5 mg onto the skin 2 (two) times daily. ( equivalent of 2 pumps twice a day). Dispense quantity sufficient  75 g  5  . elvitegravir-cobicistat-emtricitabine-tenofovir (STRIBILD) 150-150-200-300 MG TABS tablet Take 1 tablet by mouth daily with breakfast.  30 tablet  11  . tadalafil (CIALIS) 10 MG tablet Take 1 tablet (10 mg total) by mouth daily as needed for erectile dysfunction.  20 tablet  0   No current facility-administered medications on file prior to visit.   Active Ambulatory Problems    Diagnosis Date Noted  . Diarrhea 06/25/2012  . Erectile dysfunction 06/25/2012  . HIV (human immunodeficiency virus infection) 07/15/2012  . Malnutrition 07/15/2012   Resolved Ambulatory Problems    Diagnosis Date Noted  . No Resolved Ambulatory Problems   Past Medical History  Diagnosis Date  . Allergy       Review of Systems Once a week has "fuzzy feeling in his head". But no syncope; insomnia    Objective:   Physical Exam BP 114/77  Pulse 56  Temp(Src) 98.9 F (37.2 C) (Oral)  Wt 169 lb (76.658 kg) Physical Exam  Constitutional: He is oriented to person, place, and time. He appears well-developed and well-nourished. No distress.  HENT:  Mouth/Throat: Oropharynx is clear and moist. No oropharyngeal exudate.  Cardiovascular: Normal rate, regular rhythm  and normal heart sounds. Exam reveals no gallop and no friction rub.  No murmur heard.  Pulmonary/Chest: Effort normal and breath sounds normal. No respiratory distress. He has no wheezes.  Abdominal: Soft. Bowel sounds are normal. He exhibits no distension. There is no tenderness.  Lymphadenopathy:  He has no cervical adenopathy.  Neurological: He is alert and oriented to person, place, and time.  Skin: Skin is warm and dry. No rash noted. No erythema.  Psychiatric: He has a normal mood and affect. His behavior is normal.        Assessment & Plan:  hiv = continue with stribild, doing well.  Health maintenance = will do flu shot at next visit  Insomnia = will monitor, did not want to try trazodone as it didn't work in the past. He uses his partners xanax prn (2-3 per month) will check on frequency

## 2014-04-09 ENCOUNTER — Ambulatory Visit: Payer: Self-pay | Admitting: Internal Medicine

## 2014-05-08 ENCOUNTER — Other Ambulatory Visit: Payer: Self-pay | Admitting: Internal Medicine

## 2014-06-09 ENCOUNTER — Other Ambulatory Visit: Payer: Self-pay | Admitting: *Deleted

## 2014-06-09 ENCOUNTER — Telehealth: Payer: Self-pay | Admitting: *Deleted

## 2014-06-09 DIAGNOSIS — B20 Human immunodeficiency virus [HIV] disease: Secondary | ICD-10-CM

## 2014-06-09 MED ORDER — ELVITEG-COBIC-EMTRICIT-TENOFDF 150-150-200-300 MG PO TABS: 1.0000 | ORAL_TABLET | Freq: Every day | ORAL | Status: DC

## 2014-06-09 NOTE — Telephone Encounter (Signed)
Patient has changed insurance coverage, now has [facility] for his prescription drug coverage. Per [facility], patient can use his [employer] copay card if he uses the [facility] [place] [facility] [place] [facility] (copay information given, rx sent).  Patient has been set up for delivery.  [facility] will contact us if they require a prior authorization.  They will contact patient to confirm delivery.  Pharmacy preference updated in patient's chart. Andree CossHowell, Michelle M, RN

## 2014-06-10 ENCOUNTER — Other Ambulatory Visit: Payer: Self-pay | Admitting: *Deleted

## 2014-06-10 DIAGNOSIS — B20 Human immunodeficiency virus [HIV] disease: Secondary | ICD-10-CM

## 2014-06-10 MED ORDER — ELVITEG-COBIC-EMTRICIT-TENOFDF 150-150-200-300 MG PO TABS: 1.0000 | ORAL_TABLET | Freq: Every day | ORAL | Status: DC

## 2014-06-10 NOTE — Telephone Encounter (Signed)
Note in EPIC states [place] but he has f/u appts for lab and MD scheduled for 07/27/14 and 08/10/14 respectively.

## 2014-07-27 ENCOUNTER — Other Ambulatory Visit: Payer: BC Managed Care – PPO

## 2014-07-27 DIAGNOSIS — B20 Human immunodeficiency virus [HIV] disease: Secondary | ICD-10-CM

## 2014-07-27 LAB — CBC WITH DIFFERENTIAL/PLATELET
BASOS PCT: 0 % (ref 0–1)
Basophils Absolute: 0 10*3/uL (ref 0.0–0.1)
EOS ABS: 0 10*3/uL (ref 0.0–0.7)
EOS PCT: 0 % (ref 0–5)
HCT: 42.6 % (ref 39.0–52.0)
Hemoglobin: 14.3 g/dL (ref 13.0–17.0)
LYMPHS ABS: 3 10*3/uL (ref 0.7–4.0)
Lymphocytes Relative: 49 % — ABNORMAL HIGH (ref 12–46)
MCH: 27.8 pg (ref 26.0–34.0)
MCHC: 33.6 g/dL (ref 30.0–36.0)
MCV: 82.7 fL (ref 78.0–100.0)
MONOS PCT: 8 % (ref 3–12)
Monocytes Absolute: 0.5 10*3/uL (ref 0.1–1.0)
Neutro Abs: 2.7 10*3/uL (ref 1.7–7.7)
Neutrophils Relative %: 43 % (ref 43–77)
Platelets: 215 10*3/uL (ref 150–400)
RBC: 5.15 MIL/uL (ref 4.22–5.81)
RDW: 14.4 % (ref 11.5–15.5)
WBC: 6.2 10*3/uL (ref 4.0–10.5)

## 2014-07-27 LAB — COMPREHENSIVE METABOLIC PANEL
ALK PHOS: 89 U/L (ref 39–117)
ALT: 8 U/L (ref 0–53)
AST: 16 U/L (ref 0–37)
Albumin: 4.4 g/dL (ref 3.5–5.2)
BILIRUBIN TOTAL: 0.5 mg/dL (ref 0.2–1.2)
BUN: 17 mg/dL (ref 6–23)
CO2: 28 mEq/L (ref 19–32)
Calcium: 9.4 mg/dL (ref 8.4–10.5)
Chloride: 101 mEq/L (ref 96–112)
Creat: 1.01 mg/dL (ref 0.50–1.35)
GLUCOSE: 76 mg/dL (ref 70–99)
POTASSIUM: 4 meq/L (ref 3.5–5.3)
Sodium: 135 mEq/L (ref 135–145)
Total Protein: 6.7 g/dL (ref 6.0–8.3)

## 2014-07-28 LAB — T-HELPER CELL (CD4) - (RCID CLINIC ONLY)
CD4 % Helper T Cell: 35 % (ref 33–55)
CD4 T Cell Abs: 1080 /uL (ref 400–2700)

## 2014-07-28 LAB — HIV-1 RNA QUANT-NO REFLEX-BLD: HIV-1 RNA Quant, Log: <1.3 {Log} (ref <1.30)

## 2014-08-10 ENCOUNTER — Ambulatory Visit: Payer: Self-pay | Admitting: Internal Medicine

## 2014-08-17 ENCOUNTER — Ambulatory Visit (INDEPENDENT_AMBULATORY_CARE_PROVIDER_SITE_OTHER): Payer: BLUE CROSS/BLUE SHIELD | Admitting: Internal Medicine

## 2014-08-17 ENCOUNTER — Encounter: Payer: Self-pay | Admitting: Internal Medicine

## 2014-08-17 VITALS — BP 119/87 | HR 75 | Temp 98.2°F | Wt 173.0 lb

## 2014-08-17 DIAGNOSIS — G44229 Chronic tension-type headache, not intractable: Secondary | ICD-10-CM

## 2014-08-17 DIAGNOSIS — E349 Endocrine disorder, unspecified: Secondary | ICD-10-CM

## 2014-08-17 DIAGNOSIS — B20 Human immunodeficiency virus [HIV] disease: Secondary | ICD-10-CM | POA: Diagnosis not present

## 2014-08-17 DIAGNOSIS — G4700 Insomnia, unspecified: Secondary | ICD-10-CM

## 2014-08-17 DIAGNOSIS — Z23 Encounter for immunization: Secondary | ICD-10-CM | POA: Diagnosis not present

## 2014-08-17 DIAGNOSIS — E291 Testicular hypofunction: Secondary | ICD-10-CM

## 2014-08-17 MED ORDER — TESTOSTERONE ENANTHATE 200 MG/ML IM SOLN: 200.0000 mg | INTRAMUSCULAR | Status: DC

## 2014-08-17 MED ORDER — AMITRIPTYLINE HCL 25 MG PO TABS: 25.0000 mg | ORAL_TABLET | Freq: Every day | ORAL | Status: DC

## 2014-08-17 NOTE — Progress Notes (Signed)
Patient ID: Tyler Leonard, male   DOB: 03/14/1971, 43 y.o.   MRN: [id number]       Patient ID: Tyler Leonard, male   DOB: 09/05/1971, 43 y.o.   MRN: [id number]  HPI 43yo M with HIV disease, well controlled with CD 4 count of 1080/VL<20 , taking stribild without difficulty who also complains of chronic headache, frontal, last 2 hrs, spontaneously resolves. He doesn't take meds for his ha. He thinks it is partially related to stress. HA occur 3-4 x per week x 1 year. He is also having insomnia for which he takes his partner's xanax 0.5mg  prn. He recently switched insurance and was wondering if he can be switched to from topical testosterone application to injectable. He would also like a referral for derm fillers for facial wasting.  Outpatient Encounter Prescriptions as of 08/17/2014  Medication Sig  . elvitegravir-cobicistat-emtricitabine-tenofovir (STRIBILD) 150-150-200-300 MG TABS tablet Take 1 tablet by mouth daily with breakfast.  . tadalafil (CIALIS) 10 MG tablet Take 1 tablet (10 mg total) by mouth daily as needed for erectile dysfunction.  . Testosterone (ANDROGEL PUMP) 20.25 MG/ACT (1.62%) GEL Apply 1 pump to the skin twice daily     Patient Active Problem List   Diagnosis Date Noted  . HIV (human immunodeficiency virus infection) 07/15/2012  . Malnutrition 07/15/2012  . Diarrhea 06/25/2012  . Erectile dysfunction 06/25/2012     Health Maintenance Due  Topic Date Due  . Influenza Vaccine  06/06/2014     Review of Systems Positive pertinents listed in hpi. Otherwise, 10 point ros is negative Physical Exam   BP 119/87  Pulse 75  Temp(Src) 98.2 F (36.8 C) (Oral)  Wt 173 lb (78.472 kg) Physical Exam  Constitutional: He is oriented to person, place, and time. He appears well-developed and well-nourished. No distress.  HENT:  Mouth/Throat: Oropharynx is clear and moist. No oropharyngeal exudate.  Cardiovascular: Normal rate, regular rhythm and normal heart sounds. Exam  reveals no gallop and no friction rub.  No murmur heard.  Pulmonary/Chest: Effort normal and breath sounds normal. No respiratory distress. He has no wheezes.  Abdominal: Soft. Bowel sounds are normal. He exhibits no distension. There is no tenderness.  Lymphadenopathy:  He has no cervical adenopathy.  Neurological: He is alert and oriented to person, place, and time.  Skin: Skin is warm and dry. No rash noted. No erythema.  Psychiatric: He has a normal mood and affect. His behavior is normal.    Lab Results  Component Value Date   CD4TCELL 35 07/27/2014   Lab Results  Component Value Date   CD4TABS 1080 07/27/2014   CD4TABS 1170 03/26/2014   CD4TABS 680 12/25/2013   Lab Results  Component Value Date   HIV1RNAQUANT <20 07/27/2014   Lab Results  Component Value Date   HEPBSAB POS* 06/25/2012   No results found for this basename: RPR    CBC Lab Results  Component Value Date   WBC 6.2 07/27/2014   RBC 5.15 07/27/2014   HGB 14.3 07/27/2014   HCT 42.6 07/27/2014   PLT 215 07/27/2014   MCV 82.7 07/27/2014   MCH 27.8 07/27/2014   MCHC 33.6 07/27/2014   RDW 14.4 07/27/2014   LYMPHSABS 3.0 07/27/2014   MONOABS 0.5 07/27/2014   EOSABS 0.0 07/27/2014   BASOSABS 0.0 07/27/2014   BMET Lab Results  Component Value Date   NA 135 07/27/2014   K 4.0 07/27/2014   CL 101 07/27/2014   CO2 28 07/27/2014  GLUCOSE 76 07/27/2014   BUN 17 07/27/2014   CREATININE 1.01 07/27/2014   CALCIUM 9.4 07/27/2014   GFRNONAA 89 12/25/2013   GFRAA >89 12/25/2013     Assessment and Plan  hiv = well controlled, continue on sribild  Health maintenance = will give flu vaccine today  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 androgel became too cost prohibitive on his current insurance plan.  Chronic headache = will try low dose amiltryptiline 25mg  QHS to see if that ameliorates this headaches  Orthostatic hypotension/syncope = episode description appears to be  consistent with early morning, dizziness, near syncopal episode. Recommended slow transition from supine to standing in the morning. If further episodes occur, will do syncope work up.  Insomnia = will see if amiltryptiline has any effect. Mentioned that will not give xanax for sleep aid. May need to see pcp to establish care.

## 2014-08-27 ENCOUNTER — Ambulatory Visit (INDEPENDENT_AMBULATORY_CARE_PROVIDER_SITE_OTHER): Payer: BC Managed Care – PPO | Admitting: Sports Medicine

## 2014-08-27 VITALS — BP 104/70 | HR 76 | Temp 98.1°F | Resp 16 | Ht 74.0 in | Wt 171.2 lb

## 2014-08-27 DIAGNOSIS — J0100 Acute maxillary sinusitis, unspecified: Secondary | ICD-10-CM | POA: Insufficient documentation

## 2014-08-27 DIAGNOSIS — Z21 Asymptomatic human immunodeficiency virus [HIV] infection status: Secondary | ICD-10-CM

## 2014-08-27 DIAGNOSIS — J309 Allergic rhinitis, unspecified: Secondary | ICD-10-CM

## 2014-08-27 DIAGNOSIS — B20 Human immunodeficiency virus [HIV] disease: Secondary | ICD-10-CM

## 2014-08-27 MED ORDER — FLUTICASONE PROPIONATE 50 MCG/ACT NA SUSP: 2.0000 | Freq: Every day | NASAL | Status: DC

## 2014-08-27 MED ORDER — AMOXICILLIN-POT CLAVULANATE 875-125 MG PO TABS: 1.0000 | ORAL_TABLET | Freq: Two times a day (BID) | ORAL | Status: DC

## 2014-08-27 NOTE — Progress Notes (Signed)
  Tyler Leonard - 43 y.o. male MRN [id number]  Date of birth: 11/12/1970  CC & HPI:  The patient presents for evaluation of: Chief Complaint  Patient presents with  . Headache    x 2 weeks   . Dizziness    Headache and dizziness: Long standing sinus issues but worsening head pressure, dizziness/orthostasis worsening over the past 2 weeks.  Increasing maxillary and nasal pressure R>L.  No vertiginous symptoms.  Denies cough, sore throat, abdominal pain, shortness of breath.  No fevers, chills, rigors.  No dysuria, hematuria. No hematachezia or melana  ROS:  Per HPI.   HISTORY: Past Medical, Surgical, Social, and Family History Reviewed & Updated per EMR.  Pertinent Historical Findings include: HIV followed by Dr. Drue SecondSnider - well controlled. On Androgel, plans to transition to injectable but has not started Hx of Allergic Rhinitis not on meds; was previously on Flonase by stopped at last visit by Dr. Drue SecondSnider due to potential protease effects. No prior surgeries Prior cig smoker; now stopped but using VAPEs   DATA REVIEWED: Dr. Feliz BeamSnider's Last office visit and recent labs.  Noted for having similar symptoms at that time.  OBJECTIVE FINDINGS:  VS:   HT:6\' 2"  (188 cm)   WT:171 lb 3.2 oz (77.656 kg)  BMI:22          BP:104/70 mmHg  HR:76bpm  TEMP:98.1 F (36.7 C)(Oral)  RESP:99 %  PHYSICAL EXAM: GENERAL: Adult caucasian male. In no discomfort; no respiratory distress   PSYCH: alert and appropriate, good insight, slightly anxious  HNEENT: mmm, no JVD, no cervical/axillary lymphadenopathy.  No posterio-oropharyngeal erythema or exudate. No tonsilar hypertrophy. Bilateral serous middle ear effusions, without TM erythema Bilateral nasal congestion with R superior septum touching Right inferior turbinate.  +Maxillary TTP. Minimally painful with forward leaning but increased pressure Normal EOM testing without vertiginous exacerbation. No orthostatic symptoms.   CARDIAC: RRR, S1/S2 heard, no  murmur  LUNGS: CTA B, no wheezes, no crackles  ABDOMEN: + BS, soft non-tender, no organomegaly appreciated  EXTREM: Warm, well perfused.  Moves all 4 extremities spontaneously; no lateralization.  no pretibial edema.   ASSESSMENT: 1. Acute maxillary sinusitis, recurrence not specified   2. Allergic rhinitis, unspecified allergic rhinitis type   3. HIV (human immunodeficiency virus infection)    Likely underlying sinus disease with acute worsening times 2 weeks consistent with acute likely bacterial exacerbation.     PLAN: See problem based charting & AVS for additional documentation. - Augmentin X 10 days - Restart FLONASE with plans to discontinue once over acute symptoms.  Consider potential etiology of underlying dizziness and ?orthotasis from adrenal suppression (due to chronic use while on Stribild. Consider adrenal workup once off Flonase if sx persistent and not fully explained by Sinus disease - Sx treatment per AVS including sinus rinses, antihistamines, decongestants > Consider ENT eval +/- CT scan to better characterize sinuses > Return for chronic disease management at the appointment center.

## 2014-08-27 NOTE — Patient Instructions (Signed)
Antibiotic for 10 days finish all of them Pseudofed Long acting behind the counter for 5 days. SALINE RINSES - 2-3 times per day;  Restart FLONASE just for 2 weeks. Zyrtec OTC (citerizine) daily  Sinus Headache A sinus headache happens when your sinuses become clogged or puffy (swollen). Sinus headaches can be mild or severe. HOME CARE  Take your medicines (antibiotics) as told. Finish them even if you start to feel better.  Only take medicine as told by your doctor.  Use a nose spray if you feel stuffed up (congested). GET HELP RIGHT AWAY IF:  You have a fever.  You have trouble seeing.  You suddenly have pain in your face or head.  You start to twitch or shake (seizure).  You are confused.  You get headaches more than once a week.  Light or sound bothers you.  You feel sick to your stomach (nauseous) or throw up (vomit).  Your headaches do not get better with treatment. MAKE SURE YOU:  Understand these instructions.  Will watch your condition.  Will get help right away if you are not doing well or get worse. Document Released: 02/22/2011 Document Revised: 01/15/2012 Document Reviewed: 02/22/2011 [facility] Patient Information 2015 [employer] [place]. This information is not intended to replace advice given to you by your health care provider. Make sure you discuss any questions you have with your health care provider.

## 2014-09-07 ENCOUNTER — Ambulatory Visit (INDEPENDENT_AMBULATORY_CARE_PROVIDER_SITE_OTHER): Payer: BLUE CROSS/BLUE SHIELD | Admitting: *Deleted

## 2014-09-07 DIAGNOSIS — R7989 Other specified abnormal findings of blood chemistry: Secondary | ICD-10-CM

## 2014-09-07 DIAGNOSIS — E291 Testicular hypofunction: Secondary | ICD-10-CM

## 2014-09-07 MED ORDER — "SYRINGE/NEEDLE (DISP) 21G X 1"" 3 ML MISC": Status: DC

## 2014-09-07 MED ORDER — TESTOSTERONE CYPIONATE 200 MG/ML IM SOLN
200.0000 mg | Freq: Once | INTRAMUSCULAR | Status: AC
  Administered 2014-09-07: 200 mg via INTRAMUSCULAR

## 2014-09-07 NOTE — Progress Notes (Signed)
Patient filled injectable testosterone prescribed by Dr. Drue SecondSnider.  Pt brought this testosterone for IM injection and teaching.  Pt given instructions and demonstrations on how to store, clean, draw up, inject testosterone in Z-track method, rotation of injection sites, proper sharps disposal.  Pt confirmed teaching through teach back and return demonstration.  Pt tolerated injection well, all questions were answered to his satisfaction.  Order placed for needles.  Patient will come back in 2 weeks for 1 more nurse visit where he will inject himself under supervision.   Andree CossHowell, Michelle M, RN

## 2014-09-10 ENCOUNTER — Other Ambulatory Visit: Payer: Self-pay | Admitting: Licensed Clinical Social Worker

## 2014-09-10 DIAGNOSIS — B20 Human immunodeficiency virus [HIV] disease: Secondary | ICD-10-CM

## 2014-09-10 MED ORDER — ELVITEG-COBIC-EMTRICIT-TENOFDF 150-150-200-300 MG PO TABS: 1.0000 | ORAL_TABLET | Freq: Every day | ORAL | Status: DC

## 2014-09-30 ENCOUNTER — Other Ambulatory Visit: Payer: Self-pay | Admitting: Internal Medicine

## 2014-10-05 ENCOUNTER — Other Ambulatory Visit: Payer: Self-pay | Admitting: *Deleted

## 2014-10-05 ENCOUNTER — Other Ambulatory Visit: Payer: Self-pay | Admitting: Internal Medicine

## 2014-10-05 NOTE — Telephone Encounter (Signed)
Spoke with pharmacy.  Refills already available at [facility].

## 2014-10-15 ENCOUNTER — Ambulatory Visit (INDEPENDENT_AMBULATORY_CARE_PROVIDER_SITE_OTHER): Payer: BC Managed Care – PPO | Admitting: Internal Medicine

## 2014-10-15 ENCOUNTER — Encounter: Payer: Self-pay | Admitting: Internal Medicine

## 2014-10-15 VITALS — BP 114/68 | HR 64 | Temp 98.7°F | Ht 73.33 in | Wt 177.0 lb

## 2014-10-15 DIAGNOSIS — Z21 Asymptomatic human immunodeficiency virus [HIV] infection status: Secondary | ICD-10-CM

## 2014-10-15 DIAGNOSIS — J329 Chronic sinusitis, unspecified: Secondary | ICD-10-CM

## 2014-10-15 DIAGNOSIS — N529 Male erectile dysfunction, unspecified: Secondary | ICD-10-CM

## 2014-10-15 DIAGNOSIS — J309 Allergic rhinitis, unspecified: Secondary | ICD-10-CM

## 2014-10-15 DIAGNOSIS — B20 Human immunodeficiency virus [HIV] disease: Secondary | ICD-10-CM

## 2014-10-15 DIAGNOSIS — L659 Nonscarring hair loss, unspecified: Secondary | ICD-10-CM

## 2014-10-15 DIAGNOSIS — G4700 Insomnia, unspecified: Secondary | ICD-10-CM | POA: Insufficient documentation

## 2014-10-15 MED ORDER — KETOCONAZOLE 2 % EX SHAM: 1.0000 "application " | MEDICATED_SHAMPOO | CUTANEOUS | Status: DC

## 2014-10-15 MED ORDER — TRAZODONE HCL 50 MG PO TABS: 50.0000 mg | ORAL_TABLET | Freq: Every evening | ORAL | Status: DC | PRN

## 2014-10-15 NOTE — Assessment & Plan Note (Signed)
Viral load undetectable Ask ID if you can have the shingles vacccine Continue Stribild

## 2014-10-15 NOTE — Assessment & Plan Note (Signed)
He is on testosterone currently I do not see where he has had his levels checked Will discuss this further at his next visit as we ran out of time today

## 2014-10-15 NOTE — Assessment & Plan Note (Signed)
He has had good results with ketoconazole 1% shampoo OTC He would like RX for Ketoconazole 2% OTC RX done

## 2014-10-15 NOTE — Progress Notes (Signed)
HPI  Pt presents to the clinic today to establish care. He has not had a PCP in many years.   Flu: 08/2014 Tetanus: 2009 Pneumonia Vaccine: 2009 Vision Screening: yearly Dentist: biannually  HIV: He is being followed by ID. He is currently on Stribild. His last viral load was undetectable and his CD4 count was > 1000.  Allergies: He is on zyrtec. He was on Flonase in the past but has not taken it recently.  Erectile Dysfunction: He is already on testosterone replacement.He stopped the cialis. He reports that he wants to try a different brand of testosterone.  He does have some concerns today about insomnia. He has issues with falling asleep and staying asleep. He was taking Amitriptyline but reports it caused him to have bad headaches. He tried some of his spouses xanax and reports that it seemed to work well for him.  He is concerned about recurrent sinus infections. He also feels like he has something lodged in his right sinus. He has xrays of his teeth by his dentist who reports he saw an abnormality in the right sinus. He is requesting further evaluation of this today.  Past Medical History  Diagnosis Date  . Allergy   . HIV infection     Current Outpatient Prescriptions  Medication Sig Dispense Refill  . elvitegravir-cobicistat-emtricitabine-tenofovir (STRIBILD) 150-150-200-300 MG TABS tablet Take 1 tablet by mouth daily with breakfast. 90 tablet 2  . fluticasone (FLONASE) 50 MCG/ACT nasal spray Place 2 sprays into both nostrils daily. 16 g 1  . SYRINGE-NEEDLE, DISP, 3 ML 21G X 1" 3 ML MISC To draw up and inject testosterone intramuscularly as directed. 50 each 0  . tadalafil (CIALIS) 10 MG tablet Take 1 tablet (10 mg total) by mouth daily as needed for erectile dysfunction. 20 tablet 0  . testosterone enanthate (DELATESTRYL) 200 MG/ML injection Inject 1 mL (200 mg total) into the muscle every 14 (fourteen) days. For IM use only 5 mL 5   No current facility-administered  medications for this visit.    No Known Allergies  Family History  Problem Relation Age of Onset  . Alcohol abuse Mother   . Drug abuse Mother   . Cancer Mother     uterine  . Hyperlipidemia Mother   . Hypertension Mother   . Mental illness Mother   . Heart disease Maternal Grandmother   . Arthritis Paternal Grandmother   . Hypertension Paternal Grandmother      History   Social History  . Marital Status: Married    Spouse Name: N/A    Number of Children: N/A  . Years of Education: N/A   Occupational History  . Not on file.   Social History Main Topics  . Smoking status: Former Smoker -- 0.50 packs/day for 25 years    Types: Cigarettes    Quit date: 12/12/2012  . Smokeless tobacco: Never Used  . Alcohol Use: 4.0 oz/week    8 drink(s) per week     Comment: rare beer drinker   . Drug Use: No  . Sexual Activity:    Partners: Male    Birth Control/ Protection: Condom     Comment: pt. declined condoms   Other Topics Concern  . Not on file   Social History Narrative    ROS:  Constitutional: Denies fever, malaise, fatigue, headache or abrupt weight changes.  HEENT: Pt reports facial pain and nasal congestion. Denies eye pain, eye redness, ear pain, ringing in the ears, wax buildup, runny  nose, bloody nose, or sore throat. Respiratory: Denies difficulty breathing, shortness of breath, cough or sputum production.   Cardiovascular: Denies chest pain, chest tightness, palpitations or swelling in the hands or feet.  Gastrointestinal: Denies abdominal pain, bloating, constipation, diarrhea or blood in the stool.  GU: Pt reports erectile dysfunction. Denies frequency, urgency, pain with urination, blood in urine, odor or discharge. Musculoskeletal: Denies decrease in range of motion, difficulty with gait, muscle pain or joint pain and swelling.  Skin: Denies redness, rashes, lesions or ulcercations.  Neurological: Pt reports insomnia. Denies dizziness, difficulty with  memory, difficulty with speech or problems with balance and coordination.   No other specific complaints in a complete review of systems (except as listed in HPI above).  PE:  BP 114/68 mmHg  Pulse 64  Temp(Src) 98.7 F (37.1 C) (Oral)  Ht 6' 1.33" (1.863 m)  Wt 177 lb (80.287 kg)  BMI 23.13 kg/m2  SpO2 98%   Wt Readings from Last 3 Encounters:  10/15/14 177 lb (80.287 kg)  08/27/14 171 lb 3.2 oz (77.656 kg)  08/17/14 173 lb (78.472 kg)    General: Appears his stated age, well developed, well nourished in NAD. Skin: Warm dry and intact. HEENT: Head: normal shape and size, thinning hair noted, right maxillary tenderness noted; Ears: Tm's gray and intact, normal light reflex; Nose: mucosa pink and moist, septum midline; Throat/Mouth: Teeth present, mucosa pink and moist, no lesions or ulcerations noted.  Cardiovascular: Normal rate and rhythm. S1,S2 noted.  No murmur, rubs or gallops noted.  Pulmonary/Chest: Normal effort and positive vesicular breath sounds. No respiratory distress. No wheezes, rales or ronchi noted.  Neurological: Alert and oriented.  Psychiatric: Mood and affect normal. Behavior is normal. Judgment and thought content normal.     BMET    Component Value Date/Time   NA 135 07/27/2014 0917   NA 139 01/18/2012   K 4.0 07/27/2014 0917   CL 101 07/27/2014 0917   CO2 28 07/27/2014 0917   GLUCOSE 76 07/27/2014 0917   BUN 17 07/27/2014 0917   BUN 11 01/18/2012   CREATININE 1.01 07/27/2014 0917   CREATININE 0.8 01/18/2012   CALCIUM 9.4 07/27/2014 0917   GFRNONAA 89 12/25/2013 1024   GFRAA >89 12/25/2013 1024    Lipid Panel     Component Value Date/Time   CHOL 164 12/25/2013 1024   TRIG 89 12/25/2013 1024   HDL 48 12/25/2013 1024   CHOLHDL 3.4 12/25/2013 1024   VLDL 18 12/25/2013 1024   LDLCALC 98 12/25/2013 1024    CBC    Component Value Date/Time   WBC 6.2 07/27/2014 0917   WBC 7.0 01/18/2012   RBC 5.15 07/27/2014 0917   HGB 14.3 07/27/2014  0917   HCT 42.6 07/27/2014 0917   PLT 215 07/27/2014 0917   MCV 82.7 07/27/2014 0917   MCH 27.8 07/27/2014 0917   MCHC 33.6 07/27/2014 0917   RDW 14.4 07/27/2014 0917   LYMPHSABS 3.0 07/27/2014 0917   MONOABS 0.5 07/27/2014 0917   EOSABS 0.0 07/27/2014 0917   BASOSABS 0.0 07/27/2014 0917    Hgb A1C No results found for: HGBA1C   Assessment and Plan:  Recurrent sinusitis:  No infection noted on exam today Advised him to restart flonase Will refer to ENT for further evaluation

## 2014-10-15 NOTE — Assessment & Plan Note (Signed)
No current issues Takes zyrtec Advised him to restart flonase

## 2014-10-15 NOTE — Progress Notes (Signed)
Pre visit review using our clinic review tool, if applicable. No additional management support is needed unless otherwise documented below in the visit note. 

## 2014-10-15 NOTE — Assessment & Plan Note (Signed)
Advised him not to use other peoples medications Will try Trazadone He will let me know if it is ineffective

## 2014-10-15 NOTE — Patient Instructions (Signed)
Insomnia Insomnia is frequent trouble falling and/or staying asleep. Insomnia can be a long term problem or a short term problem. Both are common. Insomnia can be a short term problem when the wakefulness is related to a certain stress or worry. Long term insomnia is often related to ongoing stress during waking hours and/or poor sleeping habits. Overtime, sleep deprivation itself can make the problem worse. Every little thing feels more severe because you are overtired and your ability to cope is decreased. CAUSES   Stress, anxiety, and depression.  Poor sleeping habits.  Distractions such as TV in the bedroom.  Naps close to bedtime.  Engaging in emotionally charged conversations before bed.  Technical reading before sleep.  Alcohol and other sedatives. They may make the problem worse. They can hurt normal sleep patterns and normal dream activity.  Stimulants such as caffeine for several hours prior to bedtime.  Pain syndromes and shortness of breath can cause insomnia.  Exercise late at night.  Changing time zones may cause sleeping problems (jet lag). It is sometimes helpful to have someone observe your sleeping patterns. They should look for periods of not breathing during the night (sleep apnea). They should also look to see how long those periods last. If you live alone or observers are uncertain, you can also be observed at a sleep clinic where your sleep patterns will be professionally monitored. Sleep apnea requires a checkup and treatment. Give your caregivers your medical history. Give your caregivers observations your family has made about your sleep.  SYMPTOMS   Not feeling rested in the morning.  Anxiety and restlessness at bedtime.  Difficulty falling and staying asleep. TREATMENT   Your caregiver may prescribe treatment for an underlying medical disorders. Your caregiver can give advice or help if you are using alcohol or other drugs for self-medication. Treatment  of underlying problems will usually eliminate insomnia problems.  Medications can be prescribed for short time use. They are generally not recommended for lengthy use.  Over-the-counter sleep medicines are not recommended for lengthy use. They can be habit forming.  You can promote easier sleeping by making lifestyle changes such as:  Using relaxation techniques that help with breathing and reduce muscle tension.  Exercising earlier in the day.  Changing your diet and the time of your last meal. No night time snacks.  Establish a regular time to go to bed.  Counseling can help with stressful problems and worry.  Soothing music and white noise may be helpful if there are background noises you cannot remove.  Stop tedious detailed work at least one hour before bedtime. HOME CARE INSTRUCTIONS   Keep a diary. Inform your caregiver about your progress. This includes any medication side effects. See your caregiver regularly. Take note of:  Times when you are asleep.  Times when you are awake during the night.  The quality of your sleep.  How you feel the next day. This information will help your caregiver care for you.  Get out of bed if you are still awake after 15 minutes. Read or do some quiet activity. Keep the lights down. Wait until you feel sleepy and go back to bed.  Keep regular sleeping and waking hours. Avoid naps.  Exercise regularly.  Avoid distractions at bedtime. Distractions include watching television or engaging in any intense or detailed activity like attempting to balance the household checkbook.  Develop a bedtime ritual. Keep a familiar routine of bathing, brushing your teeth, climbing into bed at the same   time each night, listening to soothing music. Routines increase the success of falling to sleep faster.  Use relaxation techniques. This can be using breathing and muscle tension release routines. It can also include visualizing peaceful scenes. You can  also help control troubling or intruding thoughts by keeping your mind occupied with boring or repetitive thoughts like the old concept of counting sheep. You can make it more creative like imagining planting one beautiful flower after another in your backyard garden.  During your day, work to eliminate stress. When this is not possible use some of the previous suggestions to help reduce the anxiety that accompanies stressful situations. MAKE SURE YOU:   Understand these instructions.  Will watch your condition.  Will get help right away if you are not doing well or get worse. Document Released: 10/20/2000 Document Revised: 01/15/2012 Document Reviewed: 11/20/2007 Exit[employer] Patient Information 2015 [employer]. This information is not intended to replace advice given to you by your health care provider. Make sure you discuss any questions you have with your health care provider.  

## 2014-10-20 ENCOUNTER — Ambulatory Visit (INDEPENDENT_AMBULATORY_CARE_PROVIDER_SITE_OTHER): Payer: BC Managed Care – PPO | Admitting: Internal Medicine

## 2014-10-20 ENCOUNTER — Encounter: Payer: Self-pay | Admitting: Internal Medicine

## 2014-10-20 VITALS — BP 114/78 | HR 58 | Temp 98.3°F | Wt 178.0 lb

## 2014-10-20 DIAGNOSIS — L730 Acne keloid: Secondary | ICD-10-CM

## 2014-10-20 DIAGNOSIS — E291 Testicular hypofunction: Secondary | ICD-10-CM

## 2014-10-20 DIAGNOSIS — N529 Male erectile dysfunction, unspecified: Secondary | ICD-10-CM

## 2014-10-20 MED ORDER — SILDENAFIL CITRATE 50 MG PO TABS: 50.0000 mg | ORAL_TABLET | Freq: Every day | ORAL | Status: DC | PRN

## 2014-10-20 NOTE — Progress Notes (Signed)
Subjective:    Patient ID: Tyler Leonard, male    DOB: 08/27/1971, 43 y.o.   MRN: [id number]  HPI  Pt presents to the clinic today to discuss things he was not able to discuss at his new patient appt.  1- He is on testosterone supplements for hypogonadism. He was on Androgel at one point which worked well but then his insurance would no longer cover it and he could not afford to pay for it out of pocket. He reports his provider then switched him to testosterone enanthate. It does work well for him. He has more energy, increased libido, and better appetite. He was told that there was another type of injectable testosterone that is better than the one he is one and he wants to know if he should switch to it.  2- He needs a RX for Viagra. He has been on this in the past. It works well for him. He has also been on Cialis and Levitra but both of those seemed to give him headaches.  3- He wants get a referral to a dermatologist or a cosmetic surgeon for evaluation and treatment of his acne scars. These scars affect his self confidence and he would like to see what could be done to help. He has hears about subcision procedures or fillers placed under the skin but is not sure who he needs to go to for this. He has not tried anything OTC.  Review of Systems      Past Medical History  Diagnosis Date  . Allergy   . HIV infection     Current Outpatient Prescriptions  Medication Sig Dispense Refill  . Biotin 1000 MCG tablet Take 1,000 mcg by mouth 3 (three) times daily.    . cetirizine (ZYRTEC) 10 MG tablet Take 10 mg by mouth daily.    Marland Kitchen. elvitegravir-cobicistat-emtricitabine-tenofovir (STRIBILD) 150-150-200-300 MG TABS tablet Take 1 tablet by mouth daily with breakfast. 90 tablet 2  . folic acid (FOLVITE) 400 MCG tablet Take 400 mcg by mouth daily.    Chilton Si. Green Tea, Camillia sinensis, (SM GREEN TEA COMPLEX PO) Take 1 capsule by mouth daily.    Marland Kitchen. ketoconazole (NIZORAL) 2 % shampoo Apply 1  application topically 2 (two) times a week. 120 mL 0  . Misc Natural Products (PUMPKIN SEED OIL) CAPS Take 1 capsule by mouth 2 (two) times daily.    . Multiple Vitamin (MULTIVITAMIN) tablet Take 1 tablet by mouth daily.    Marland Kitchen. pyridOXINE (VITAMIN B-6) 100 MG tablet Take 100 mg by mouth daily.    . Saw Palmetto 450 MG CAPS Take 2 capsules by mouth 2 (two) times daily.    . SYRINGE-NEEDLE, DISP, 3 ML 21G X 1" 3 ML MISC To draw up and inject testosterone intramuscularly as directed. 50 each 0  . tadalafil (CIALIS) 10 MG tablet Take 1 tablet (10 mg total) by mouth daily as needed for erectile dysfunction. 20 tablet 0  . testosterone enanthate (DELATESTRYL) 200 MG/ML injection Inject 1 mL (200 mg total) into the muscle every 14 (fourteen) days. For IM use only 5 mL 5  . traZODone (DESYREL) 50 MG tablet Take 1 tablet (50 mg total) by mouth at bedtime as needed for sleep. 30 tablet 0  . vitamin A 8295610000 UNIT capsule Take 10,000 Units by mouth daily.    . vitamin B-12 (CYANOCOBALAMIN) 500 MCG tablet Take 500 mcg by mouth daily.    . vitamin C (ASCORBIC ACID) 500 MG tablet Take 500  mg by mouth daily.    . vitamin E 400 UNIT capsule Take 400 Units by mouth daily.    Marland Kitchen zinc gluconate 50 MG tablet Take 50 mg by mouth daily.     No current facility-administered medications for this visit.    No Known Allergies  Family History  Problem Relation Age of Onset  . Alcohol abuse Mother   . Drug abuse Mother   . Cancer Mother     uterine  . Hyperlipidemia Mother   . Hypertension Mother   . Mental illness Mother   . Heart disease Maternal Grandmother   . Arthritis Paternal Grandmother   . Hypertension Paternal Grandmother     History   Social History  . Marital Status: Married    Spouse Name: N/A    Number of Children: N/A  . Years of Education: N/A   Occupational History  . Not on file.   Social History Main Topics  . Smoking status: Former Smoker -- 0.50 packs/day for 25 years    Types:  Cigarettes    Quit date: 12/12/2012  . Smokeless tobacco: Never Used  . Alcohol Use: 4.8 oz/week    8 Not specified per week     Comment: rare beer drinker   . Drug Use: No  . Sexual Activity:    Partners: Male    Birth Control/ Protection: Condom     Comment: pt. declined condoms   Other Topics Concern  . Not on file   Social History Narrative     Constitutional: Denies fever, malaise, fatigue, headache or abrupt weight changes.  HEENT: Denies eye pain, eye redness, ear pain, ringing in the ears, wax buildup, runny nose, nasal congestion, bloody nose, or sore throat. Respiratory: Denies difficulty breathing, shortness of breath, cough or sputum production.   Cardiovascular: Denies chest pain, chest tightness, palpitations or swelling in the hands or feet.  Gastrointestinal: Denies abdominal pain, bloating, constipation, diarrhea or blood in the stool.  GU: Pt reports erectile dysfunction. Denies urgency, frequency, pain with urination, burning sensation, blood in urine, odor or discharge. Musculoskeletal: Denies decrease in range of motion, difficulty with gait, muscle pain or joint pain and swelling.  Skin: Pt reports facial scarring. Denies redness, rashes, lesions or ulcercations.  Neurological: Denies dizziness, difficulty with memory, difficulty with speech or problems with balance and coordination.   No other specific complaints in a complete review of systems (except as listed in HPI above).  Objective:   Physical Exam   BP 114/78 mmHg  Pulse 58  Temp(Src) 98.3 F (36.8 C) (Oral)  Wt 178 lb (80.74 kg)  SpO2 98% Wt Readings from Last 3 Encounters:  10/20/14 178 lb (80.74 kg)  10/15/14 177 lb (80.287 kg)  08/27/14 171 lb 3.2 oz (77.656 kg)    General: Appears his stated age, well developed, well nourished in NAD. Skin: Warm, dry and intact. Acne scars noted on face bilaterally. Cardiovascular: Normal rate and rhythm. S1,S2 noted.  No murmur, rubs or gallops  noted.  Pulmonary/Chest: Normal effort and positive vesicular breath sounds. No respiratory distress. No wheezes, rales or ronchi noted.  Abdomen: Soft and nontender. Normal bowel sounds, no bruits noted. No distention or masses noted. Liver, spleen and kidneys non palpable.  BMET    Component Value Date/Time   NA 135 07/27/2014 0917   NA 139 01/18/2012   K 4.0 07/27/2014 0917   CL 101 07/27/2014 0917   CO2 28 07/27/2014 0917   GLUCOSE 76 07/27/2014 0917  BUN 17 07/27/2014 0917   BUN 11 01/18/2012   CREATININE 1.01 07/27/2014 0917   CREATININE 0.8 01/18/2012   CALCIUM 9.4 07/27/2014 0917   GFRNONAA 89 12/25/2013 1024   GFRAA >89 12/25/2013 1024    Lipid Panel     Component Value Date/Time   CHOL 164 12/25/2013 1024   TRIG 89 12/25/2013 1024   HDL 48 12/25/2013 1024   CHOLHDL 3.4 12/25/2013 1024   VLDL 18 12/25/2013 1024   LDLCALC 98 12/25/2013 1024    CBC    Component Value Date/Time   WBC 6.2 07/27/2014 0917   WBC 7.0 01/18/2012   RBC 5.15 07/27/2014 0917   HGB 14.3 07/27/2014 0917   HCT 42.6 07/27/2014 0917   PLT 215 07/27/2014 0917   MCV 82.7 07/27/2014 0917   MCH 27.8 07/27/2014 0917   MCHC 33.6 07/27/2014 0917   RDW 14.4 07/27/2014 0917   LYMPHSABS 3.0 07/27/2014 0917   MONOABS 0.5 07/27/2014 0917   EOSABS 0.0 07/27/2014 0917   BASOSABS 0.0 07/27/2014 0917    Hgb A1C No results found for: HGBA1C      Assessment & Plan:   Acne Scars:  Will call around to different dermatologist in the area to see what they can offer you.  Will place referral after I find out where you can needed.  RTC in 1 year or sooner if needed

## 2014-10-20 NOTE — Progress Notes (Signed)
Pre visit review using our clinic review tool, if applicable. No additional management support is needed unless otherwise documented below in the visit note. 

## 2014-10-20 NOTE — Assessment & Plan Note (Signed)
RX given for Viagra today Discount coupon provided

## 2014-10-20 NOTE — Assessment & Plan Note (Signed)
No testosterone levels to review He seems to be doing well on his current testosterone supplement Will check testosterone level at next visit

## 2014-10-20 NOTE — Patient Instructions (Signed)
Erectile Dysfunction Erectile dysfunction is the inability to get or sustain a good enough erection to have sexual intercourse. Erectile dysfunction may involve:  Inability to get an erection.  Lack of enough hardness to allow penetration.  Loss of the erection before sex is finished.  Premature ejaculation. CAUSES  Certain drugs, such as:  Pain relievers.  Antihistamines.  Antidepressants.  Blood pressure medicines.  Water pills (diuretics).  Ulcer medicines.  Muscle relaxants.  Illegal drugs.  Excessive drinking.  Psychological causes, such as:  Anxiety.  Depression.  Sadness.  Exhaustion.  Performance fear.  Stress.  Physical causes, such as:  Artery problems. This may include diabetes, smoking, liver disease, or atherosclerosis.  High blood pressure.  Hormonal problems, such as low testosterone.  Obesity.  Nerve problems. This may include back or pelvic injuries, diabetes mellitus, multiple sclerosis, or Parkinson disease. SYMPTOMS  Inability to get an erection.  Lack of enough hardness to allow penetration.  Loss of the erection before sex is finished.  Premature ejaculation.  Normal erections at some times, but with frequent unsatisfactory episodes.  Orgasms that are not satisfactory in sensation or frequency.  Low sexual satisfaction in either partner because of erection problems.  A curved penis occurring with erection. The curve may cause pain or may be too curved to allow for intercourse.  Never having nighttime erections. DIAGNOSIS Your caregiver can often diagnose this condition by:  Performing a physical exam to find other diseases or specific problems with the penis.  Asking you detailed questions about the problem.  Performing blood tests to check for diabetes mellitus or to measure hormone levels.  Performing urine tests to find other underlying health conditions.  Performing an ultrasound exam to check for  scarring.  Performing a test to check blood flow to the penis.  Doing a sleep study at home to measure nighttime erections. TREATMENT   You may be prescribed medicines by mouth.  You may be given medicine injections into the penis.  You may be prescribed a vacuum pump with a ring.  Penile implant surgery may be performed. You may receive:  An inflatable implant.  A semirigid implant.  Blood vessel surgery may be performed. HOME CARE INSTRUCTIONS  If you are prescribed oral medicine, you should take the medicine as prescribed. Do not increase the dosage without first discussing it with your physician.  If you are using self-injections, be careful to avoid any veins that are on the surface of the penis. Apply pressure to the injection site for 5 minutes.  If you are using a vacuum pump, make sure you have read the instructions before using it. Discuss any questions with your physician before taking the pump home. SEEK MEDICAL CARE IF:  You experience pain that is not responsive to the pain medicine you have been prescribed.  You experience nausea or vomiting. SEEK IMMEDIATE MEDICAL CARE IF:   When taking oral or injectable medications, you experience an erection that lasts longer than 4 hours. If your physician is unavailable, go to the nearest emergency room for evaluation. An erection that lasts much longer than 4 hours can result in permanent damage to your penis.  You have pain that is severe.  You develop redness, severe pain, or severe swelling of your penis.  You have redness spreading up into your groin or lower abdomen.  You are unable to pass your urine. Document Released: 10/20/2000 Document Revised: 06/25/2013 Document Reviewed: 03/27/2013 Exit[employer] Patient Information 2015 [employer]. This information is not   intended to replace advice given to you by your health care provider. Make sure you discuss any questions you have with your health care provider.  

## 2014-10-23 ENCOUNTER — Other Ambulatory Visit: Payer: Self-pay | Admitting: Internal Medicine

## 2014-10-23 DIAGNOSIS — L730 Acne keloid: Secondary | ICD-10-CM

## 2014-11-02 ENCOUNTER — Other Ambulatory Visit: Payer: BC Managed Care – PPO

## 2014-11-02 DIAGNOSIS — B20 Human immunodeficiency virus [HIV] disease: Secondary | ICD-10-CM

## 2014-11-02 LAB — CBC WITH DIFFERENTIAL/PLATELET
BASOS ABS: 0 10*3/uL (ref 0.0–0.1)
Basophils Relative: 0 % (ref 0–1)
Eosinophils Absolute: 0.1 10*3/uL (ref 0.0–0.7)
Eosinophils Relative: 1 % (ref 0–5)
HCT: 43.8 % (ref 39.0–52.0)
Hemoglobin: 14.1 g/dL (ref 13.0–17.0)
LYMPHS ABS: 3.2 10*3/uL (ref 0.7–4.0)
LYMPHS PCT: 30 % (ref 12–46)
MCH: 28 pg (ref 26.0–34.0)
MCHC: 32.2 g/dL (ref 30.0–36.0)
MCV: 86.9 fL (ref 78.0–100.0)
MONO ABS: 0.9 10*3/uL (ref 0.1–1.0)
MONOS PCT: 9 % (ref 3–12)
MPV: 10.2 fL (ref 9.4–12.4)
NEUTROS ABS: 6.3 10*3/uL (ref 1.7–7.7)
Neutrophils Relative %: 60 % (ref 43–77)
Platelets: 195 10*3/uL (ref 150–400)
RBC: 5.04 MIL/uL (ref 4.22–5.81)
RDW: 14.2 % (ref 11.5–15.5)
WBC: 10.5 10*3/uL (ref 4.0–10.5)

## 2014-11-02 LAB — COMPREHENSIVE METABOLIC PANEL
ALBUMIN: 4 g/dL (ref 3.5–5.2)
ALT: 13 U/L (ref 0–53)
AST: 16 U/L (ref 0–37)
Alkaline Phosphatase: 87 U/L (ref 39–117)
BUN: 16 mg/dL (ref 6–23)
CO2: 25 mEq/L (ref 19–32)
CREATININE: 0.93 mg/dL (ref 0.50–1.35)
Calcium: 8.8 mg/dL (ref 8.4–10.5)
Chloride: 105 mEq/L (ref 96–112)
Glucose, Bld: 82 mg/dL (ref 70–99)
POTASSIUM: 4 meq/L (ref 3.5–5.3)
SODIUM: 138 meq/L (ref 135–145)
Total Bilirubin: 0.4 mg/dL (ref 0.2–1.2)
Total Protein: 6.2 g/dL (ref 6.0–8.3)

## 2014-11-03 LAB — T-HELPER CELL (CD4) - (RCID CLINIC ONLY)
CD4 T CELL ABS: 1170 /uL (ref 400–2700)
CD4 T CELL HELPER: 35 % (ref 33–55)

## 2014-11-04 LAB — HIV-1 RNA QUANT-NO REFLEX-BLD
HIV 1 RNA Quant: <20 copies/mL (ref <20)
HIV-1 RNA Quant, Log: <1.3 {Log} (ref <1.30)

## 2014-11-06 LAB — HLA B*5701: HLA-B 5701 W/RFLX HLA-B HIGH: NEGATIVE

## 2014-11-08 ENCOUNTER — Other Ambulatory Visit: Payer: Self-pay | Admitting: Internal Medicine

## 2014-11-16 ENCOUNTER — Encounter: Payer: Self-pay | Admitting: Internal Medicine

## 2014-11-16 ENCOUNTER — Ambulatory Visit (INDEPENDENT_AMBULATORY_CARE_PROVIDER_SITE_OTHER): Payer: BLUE CROSS/BLUE SHIELD | Admitting: Internal Medicine

## 2014-11-16 VITALS — BP 134/70 | HR 79 | Temp 97.7°F | Wt 188.0 lb

## 2014-11-16 DIAGNOSIS — E349 Endocrine disorder, unspecified: Secondary | ICD-10-CM

## 2014-11-16 DIAGNOSIS — E291 Testicular hypofunction: Secondary | ICD-10-CM

## 2014-11-16 DIAGNOSIS — B20 Human immunodeficiency virus [HIV] disease: Secondary | ICD-10-CM

## 2014-11-16 DIAGNOSIS — Z21 Asymptomatic human immunodeficiency virus [HIV] infection status: Secondary | ICD-10-CM

## 2014-11-16 NOTE — Progress Notes (Signed)
Patient ID: Tyler FeelingLemuel G Nasca, male   DOB: 09/29/1971, 44 y.o.   MRN: [id number]       Patient ID: Tyler Leonard, male   DOB: 09/06/1971, 44 y.o.   MRN: [id number]  HPI 44yo M with HIV disease, well controlled, CD 4 count of 1170/VL<20 on stribild, hep B immune. Hx of hypogonadism/ED, on testoterone injection. Also takes numerous supplements. He is noticing pain in thigh for 1-2 days after testosterone injection. Did establish care with pcp as well as had referral for allergic rhinitis -> found to have nasal polyp and diagnosed with chronic sinusitis  Outpatient Encounter Prescriptions as of 11/16/2014  Medication Sig  . Biotin 1000 MCG tablet Take 1,000 mcg by mouth 3 (three) times daily.  . cetirizine (ZYRTEC) 10 MG tablet Take 10 mg by mouth daily.  Marland Kitchen. elvitegravir-cobicistat-emtricitabine-tenofovir (STRIBILD) 150-150-200-300 MG TABS tablet Take 1 tablet by mouth daily with breakfast.  . folic acid (FOLVITE) 400 MCG tablet Take 400 mcg by mouth daily.  Chilton Si. Green Tea, Camillia sinensis, (SM GREEN TEA COMPLEX PO) Take 1 capsule by mouth daily.  Marland Kitchen. ketoconazole (NIZORAL) 2 % shampoo Apply 1 application topically 2 (two) times a week.  . Misc Natural Products (PUMPKIN SEED OIL) CAPS Take 1 capsule by mouth 2 (two) times daily.  . Multiple Vitamin (MULTIVITAMIN) tablet Take 1 tablet by mouth daily.  Marland Kitchen. pyridOXINE (VITAMIN B-6) 100 MG tablet Take 100 mg by mouth daily.  . Saw Palmetto 450 MG CAPS Take 2 capsules by mouth 2 (two) times daily.  . sildenafil (VIAGRA) 50 MG tablet Take 1 tablet (50 mg total) by mouth daily as needed for erectile dysfunction.  . SYRINGE-NEEDLE, DISP, 3 ML 21G X 1" 3 ML MISC To draw up and inject testosterone intramuscularly as directed.  . testosterone enanthate (DELATESTRYL) 200 MG/ML injection Inject 1 mL (200 mg total) into the muscle every 14 (fourteen) days. For IM use only  . traZODone (DESYREL) 50 MG tablet TAKE 1 TABLET (50 MG TOTAL) BY MOUTH AT BEDTIME AS NEEDED FOR  SLEEP.  Marland Kitchen. vitamin A [id number] UNIT capsule Take 10,000 Units by mouth daily.  . vitamin B-12 (CYANOCOBALAMIN) 500 MCG tablet Take 500 mcg by mouth daily.  . vitamin C (ASCORBIC ACID) 500 MG tablet Take 500 mg by mouth daily.  . vitamin E 400 UNIT capsule Take 400 Units by mouth daily.  Marland Kitchen. zinc gluconate 50 MG tablet Take 50 mg by mouth daily.     Patient Active Problem List   Diagnosis Date Noted  . 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 Hair loss 10/15/2014  . Insomnia 10/15/2014  . Allergic rhinitis 08/27/2014  . HIV (human immunodeficiency virus infection) 07/15/2012  . Erectile dysfunction 06/25/2012     There are no preventive care reminders to display for this patient.   Review of Systems Review of Systems  Constitutional: Negative for fever, chills, diaphoresis, activity change, appetite change, fatigue and unexpected weight change.  HENT: Negative for congestion, sore throat, rhinorrhea, sneezing, trouble swallowing and sinus pressure.  Eyes: Negative for photophobia and visual disturbance.  Respiratory: Negative for cough, chest tightness, shortness of breath, wheezing and stridor.  Cardiovascular: Negative for chest pain, palpitations and leg swelling.  Gastrointestinal: Negative for nausea, vomiting, abdominal pain, diarrhea, constipation, blood in stool, abdominal distention and anal bleeding.  Genitourinary: Negative for dysuria, hematuria, flank pain and difficulty urinating.  Musculoskeletal: Negative for myalgias, back pain, joint swelling, arthralgias and gait problem.  Skin: Negative for color change, pallor, rash  and wound.  Neurological: Negative for dizziness, tremors, weakness and light-headedness.  Hematological: Negative for adenopathy. Does not bruise/bleed easily.  Psychiatric/Behavioral: Negative for behavioral problems, confusion, sleep disturbance, dysphoric mood, decreased concentration and agitation.    Physical Exam   BP 134/70 mmHg  Pulse 79  Temp(Src)  97.7 F (36.5 C) (Oral)  Wt 188 lb (85.276 kg) Physical Exam  Constitutional: He is oriented to person, place, and time. He appears well-developed and well-nourished. No distress.  HENT:  Mouth/Throat: Oropharynx is clear and moist. No oropharyngeal exudate.  Cardiovascular: Normal rate, regular rhythm and normal heart sounds. Exam reveals no gallop and no friction rub.  No murmur heard.  Pulmonary/Chest: Effort normal and breath sounds normal. No respiratory distress. He has no wheezes.  Abdominal: Soft. Bowel sounds are normal. He exhibits no distension. There is no tenderness.  Lymphadenopathy:  He has no cervical adenopathy.  Neurological: He is alert and oriented to person, place, and time.  Skin: Skin is warm and dry. No rash noted. No erythema.  Psychiatric: He has a normal mood and affect. His behavior is normal.    Lab Results  Component Value Date   CD4TCELL 35 11/02/2014   Lab Results  Component Value Date   CD4TABS 1170 11/02/2014   CD4TABS 1080 07/27/2014   CD4TABS 1170 03/26/2014   Lab Results  Component Value Date   HIV1RNAQUANT <20 11/02/2014   Lab Results  Component Value Date   HEPBSAB POS* 06/25/2012   No results found for: RPR  CBC Lab Results  Component Value Date   WBC 10.5 11/02/2014   RBC 5.04 11/02/2014   HGB 14.1 11/02/2014   HCT 43.8 11/02/2014   PLT 195 11/02/2014   MCV 86.9 11/02/2014   MCH 28.0 11/02/2014   MCHC 32.2 11/02/2014   RDW 14.2 11/02/2014   LYMPHSABS 3.2 11/02/2014   MONOABS 0.9 11/02/2014   EOSABS 0.1 11/02/2014   BASOSABS 0.0 11/02/2014   BMET Lab Results  Component Value Date   NA 138 11/02/2014   K 4.0 11/02/2014   CL 105 11/02/2014   CO2 25 11/02/2014   GLUCOSE 82 11/02/2014   BUN 16 11/02/2014   CREATININE 0.93 11/02/2014   CALCIUM 8.8 11/02/2014   GFRNONAA 89 12/25/2013   GFRAA >89 12/25/2013     Assessment and Plan  hiv = continue on stribild. Doing well.  Supplements = will ask him to stop  taking vitamin A. Can take saw palmetto, no interactions  Testosterone = reviewed how to inject to minimize discomfort  rtc in 3 months

## 2014-12-04 ENCOUNTER — Other Ambulatory Visit: Payer: Self-pay

## 2014-12-04 MED ORDER — TRAZODONE HCL 50 MG PO TABS: ORAL_TABLET | ORAL | Status: DC

## 2014-12-04 NOTE — Telephone Encounter (Signed)
Pt is requesting refill for 90 day supply--last filled 11/11/14--please advise if okay to refill

## 2015-01-13 ENCOUNTER — Other Ambulatory Visit: Payer: Self-pay | Admitting: *Deleted

## 2015-01-13 DIAGNOSIS — Z113 Encounter for screening for infections with a predominantly sexual mode of transmission: Secondary | ICD-10-CM

## 2015-01-13 DIAGNOSIS — B20 Human immunodeficiency virus [HIV] disease: Secondary | ICD-10-CM

## 2015-02-09 ENCOUNTER — Other Ambulatory Visit: Payer: BLUE CROSS/BLUE SHIELD

## 2015-02-09 DIAGNOSIS — B20 Human immunodeficiency virus [HIV] disease: Secondary | ICD-10-CM

## 2015-02-09 DIAGNOSIS — Z113 Encounter for screening for infections with a predominantly sexual mode of transmission: Secondary | ICD-10-CM

## 2015-02-09 LAB — CBC WITH DIFFERENTIAL/PLATELET
Basophils Absolute: 0 10*3/uL (ref 0.0–0.1)
Basophils Relative: 0 % (ref 0–1)
Eosinophils Absolute: 0.1 10*3/uL (ref 0.0–0.7)
Eosinophils Relative: 1 % (ref 0–5)
HCT: 46.8 % (ref 39.0–52.0)
HEMOGLOBIN: 15.4 g/dL (ref 13.0–17.0)
LYMPHS ABS: 3.4 10*3/uL (ref 0.7–4.0)
LYMPHS PCT: 51 % — AB (ref 12–46)
MCH: 27.3 pg (ref 26.0–34.0)
MCHC: 32.9 g/dL (ref 30.0–36.0)
MCV: 82.8 fL (ref 78.0–100.0)
MPV: 9.6 fL (ref 8.6–12.4)
Monocytes Absolute: 0.5 10*3/uL (ref 0.1–1.0)
Monocytes Relative: 7 % (ref 3–12)
Neutro Abs: 2.7 10*3/uL (ref 1.7–7.7)
Neutrophils Relative %: 41 % — ABNORMAL LOW (ref 43–77)
Platelets: 230 10*3/uL (ref 150–400)
RBC: 5.65 MIL/uL (ref 4.22–5.81)
RDW: 15.2 % (ref 11.5–15.5)
WBC: 6.6 10*3/uL (ref 4.0–10.5)

## 2015-02-09 LAB — COMPLETE METABOLIC PANEL WITH GFR
ALBUMIN: 4.3 g/dL (ref 3.5–5.2)
ALT: 12 U/L (ref 0–53)
AST: 15 U/L (ref 0–37)
Alkaline Phosphatase: 88 U/L (ref 39–117)
BUN: 12 mg/dL (ref 6–23)
CALCIUM: 9.3 mg/dL (ref 8.4–10.5)
CHLORIDE: 102 meq/L (ref 96–112)
CO2: 28 meq/L (ref 19–32)
Creat: 0.94 mg/dL (ref 0.50–1.35)
Glucose, Bld: 56 mg/dL — ABNORMAL LOW (ref 70–99)
Potassium: 3.7 mEq/L (ref 3.5–5.3)
Sodium: 139 mEq/L (ref 135–145)
Total Bilirubin: 0.4 mg/dL (ref 0.2–1.2)
Total Protein: 6.4 g/dL (ref 6.0–8.3)

## 2015-02-09 LAB — RPR

## 2015-02-10 LAB — T-HELPER CELL (CD4) - (RCID CLINIC ONLY)
CD4 T CELL HELPER: 31 % — AB (ref 33–55)
CD4 T Cell Abs: 1050 /uL (ref 400–2700)

## 2015-02-10 LAB — HIV-1 RNA QUANT-NO REFLEX-BLD
HIV 1 RNA QUANT: 50 {copies}/mL — AB (ref <20)
HIV-1 RNA Quant, Log: 1.7 {Log} — ABNORMAL HIGH (ref <1.30)

## 2015-02-15 ENCOUNTER — Other Ambulatory Visit: Payer: Self-pay | Admitting: Internal Medicine

## 2015-02-18 ENCOUNTER — Other Ambulatory Visit: Payer: Self-pay | Admitting: *Deleted

## 2015-02-18 DIAGNOSIS — E349 Endocrine disorder, unspecified: Secondary | ICD-10-CM

## 2015-02-18 MED ORDER — TESTOSTERONE ENANTHATE 200 MG/ML IM SOLN: 200.0000 mg | INTRAMUSCULAR | Status: DC

## 2015-02-23 ENCOUNTER — Ambulatory Visit: Payer: BLUE CROSS/BLUE SHIELD | Admitting: Internal Medicine

## 2015-03-02 ENCOUNTER — Encounter: Payer: Self-pay | Admitting: Internal Medicine

## 2015-03-02 ENCOUNTER — Ambulatory Visit (INDEPENDENT_AMBULATORY_CARE_PROVIDER_SITE_OTHER): Payer: BLUE CROSS/BLUE SHIELD | Admitting: Internal Medicine

## 2015-03-02 VITALS — BP 118/81 | HR 84 | Temp 97.6°F | Wt 188.0 lb

## 2015-03-02 DIAGNOSIS — E291 Testicular hypofunction: Secondary | ICD-10-CM | POA: Diagnosis not present

## 2015-03-02 DIAGNOSIS — J301 Allergic rhinitis due to pollen: Secondary | ICD-10-CM | POA: Diagnosis not present

## 2015-03-02 DIAGNOSIS — Z21 Asymptomatic human immunodeficiency virus [HIV] infection status: Secondary | ICD-10-CM

## 2015-03-02 DIAGNOSIS — E349 Endocrine disorder, unspecified: Secondary | ICD-10-CM

## 2015-03-02 DIAGNOSIS — B20 Human immunodeficiency virus [HIV] disease: Secondary | ICD-10-CM

## 2015-03-02 MED ORDER — FLUNISOLIDE 25 MCG/ACT (0.025%) NA SOLN: 2.0000 | Freq: Two times a day (BID) | NASAL | Status: DC

## 2015-03-02 NOTE — Progress Notes (Signed)
Patient ID: Tyler Leonard, male   DOB: 18-Jun-1971, 44 y.o.   MRN: [id number]       Patient ID: LANIER MILLON, male   DOB: 02/06/1971, 44 y.o.   MRN: [id number]  HPI  44yo M with HIV disease, hypogonadism, CD 4 count of 1050/VL 50 (increased from <20) in April 2016. On stribild. Doing well with energy level, continues on testosterone injection. He is having difficulty with seasonal allergies. Zyrtec not working  Outpatient Encounter Prescriptions as of 03/02/2015  Medication Sig  . Biotin 1000 MCG tablet Take 1,000 mcg by mouth 3 (three) times daily.  . cetirizine (ZYRTEC) 10 MG tablet Take 10 mg by mouth daily.  Marland Kitchen elvitegravir-cobicistat-emtricitabine-tenofovir (STRIBILD) 150-150-200-300 MG TABS tablet Take 1 tablet by mouth daily with breakfast.  . folic acid (FOLVITE) 400 MCG tablet Take 400 mcg by mouth daily.  Chilton Si Tea, Camillia sinensis, (SM GREEN TEA COMPLEX PO) Take 1 capsule by mouth daily.  Marland Kitchen ketoconazole (NIZORAL) 2 % shampoo Apply 1 application topically 2 (two) times a week.  . Misc Natural Products (PUMPKIN SEED OIL) CAPS Take 1 capsule by mouth 2 (two) times daily.  . Multiple Vitamin (MULTIVITAMIN) tablet Take 1 tablet by mouth daily.  Marland Kitchen pyridOXINE (VITAMIN B-6) 100 MG tablet Take 100 mg by mouth daily.  . Saw Palmetto 450 MG CAPS Take 2 capsules by mouth 2 (two) times daily.  . sildenafil (VIAGRA) 50 MG tablet Take 1 tablet (50 mg total) by mouth daily as needed for erectile dysfunction.  . SYRINGE-NEEDLE, DISP, 3 ML 21G X 1" 3 ML MISC To draw up and inject testosterone intramuscularly as directed.  . testosterone enanthate (DELATESTRYL) 200 MG/ML injection Inject 1 mL (200 mg total) into the muscle every 14 (fourteen) days. For IM use only  . traZODone (DESYREL) 50 MG tablet TAKE 1 TABLET (50 MG TOTAL) BY MOUTH AT BEDTIME AS NEEDED FOR SLEEP.  Marland Kitchen vitamin A 78295 UNIT capsule Take 10,000 Units by mouth daily.  . vitamin B-12 (CYANOCOBALAMIN) 500 MCG tablet Take 500 mcg by  mouth daily.  . vitamin C (ASCORBIC ACID) 500 MG tablet Take 500 mg by mouth daily.  . vitamin E 400 UNIT capsule Take 400 Units by mouth daily.  Marland Kitchen zinc gluconate 50 MG tablet Take 50 mg by mouth daily.     Patient Active Problem List   Diagnosis Date Noted  . 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 Hair loss 10/15/2014  . Insomnia 10/15/2014  . Allergic rhinitis 08/27/2014  . HIV (human immunodeficiency virus infection) 07/15/2012  . Erectile dysfunction 06/25/2012     There are no preventive care reminders to display for this patient.   Review of Systems 10 point ros is negative except for nasal congestion, drainage, occ cough Physical Exam   BP 118/81 mmHg  Pulse 84  Temp(Src) 97.6 F (36.4 C) (Oral)  Wt 188 lb (85.276 kg) Physical Exam  Constitutional: He is oriented to person, place, and time. He appears well-developed and well-nourished. No distress.  HENT: nasal turbinates inflammed Mouth/Throat: Oropharynx is clear and moist. No oropharyngeal exudate.  Cardiovascular: Normal rate, regular rhythm and normal heart sounds. Exam reveals no gallop and no friction rub.  No murmur heard.  Pulmonary/Chest: Effort normal and breath sounds normal. No respiratory distress. He has no wheezes.  Abdominal: Soft. Bowel sounds are normal. He exhibits no distension. There is no tenderness.  Lymphadenopathy:  He has no cervical adenopathy.  Neurological: He is alert and oriented to person,  place, and time.  Skin: Skin is warm and dry. No rash noted. No erythema.  Psychiatric: He has a normal mood and affect. His behavior is normal.    Lab Results  Component Value Date   CD4TCELL 31* 02/09/2015   Lab Results  Component Value Date   CD4TABS 1050 02/09/2015   CD4TABS 1170 11/02/2014   CD4TABS 1080 07/27/2014   Lab Results  Component Value Date   HIV1RNAQUANT 50* 02/09/2015   Lab Results  Component Value Date   HEPBSAB POS* 06/25/2012   No results found for: RPR  CBC Lab  Results  Component Value Date   WBC 6.6 02/09/2015   RBC 5.65 02/09/2015   HGB 15.4 02/09/2015   HCT 46.8 02/09/2015   PLT 230 02/09/2015   MCV 82.8 02/09/2015   MCH 27.3 02/09/2015   MCHC 32.9 02/09/2015   RDW 15.2 02/09/2015   LYMPHSABS 3.4 02/09/2015   MONOABS 0.5 02/09/2015   EOSABS 0.1 02/09/2015   BASOSABS 0.0 02/09/2015   BMET Lab Results  Component Value Date   NA 139 02/09/2015   K 3.7 02/09/2015   CL 102 02/09/2015   CO2 28 02/09/2015   GLUCOSE 56* 02/09/2015   BUN 12 02/09/2015   CREATININE 0.94 02/09/2015   CALCIUM 9.3 02/09/2015   GFRNONAA >89 02/09/2015   GFRAA >89 02/09/2015     Assessment and Plan  HIV disease =continue with stribild, well controlled  Allergic rhinitis = will have him try claritin and give flunisolone to minimize drug interaction  Hypogonadism = continue with testoterone injection  Hypoglycemia = asymptomatic

## 2015-03-02 NOTE — Progress Notes (Signed)
   Subjective:    Patient ID: Tyler Leonard, male    DOB: 07/31/1971, 44 y.o.   MRN: [id number]  HPI Tyler Leonard is a 44y/o male with history of HIV, allergic rhinitis, . He presents to clinic for three month follow up for HIV and recent switch to testosterone injections. He states he is doing well with the injections and denies any complaints regarding this medication. He has been taking his Stribild and does not miss any doses. He had a small blip in his VL this month and is currently 50 with CD4 count of 1050. He also states that he has a lot of trouble with allergies as he gets headaches, runny nose and itchy throat. He had been taking flonase with good relief however stopped this mediucation when he found out this interacted with his ARV regimen. He has tried both Zyrtec and Claritin without relief and would like suggestions on what to take for this.    Lab Results  Component Value Date   CD4TABS 1050 02/09/2015   CD4TABS 1170 11/02/2014   CD4TABS 1080 07/27/2014     Review of Systems     Objective:   Physical Exam        Assessment & Plan:  HIV: RTC in three months  Allergic rhinitis: Start Flunisolide Restart Claritin

## 2015-03-22 ENCOUNTER — Other Ambulatory Visit: Payer: Self-pay | Admitting: Internal Medicine

## 2015-03-23 NOTE — Telephone Encounter (Signed)
Last filled 10/2014--please advise if okay to refill

## 2015-04-12 ENCOUNTER — Other Ambulatory Visit: Payer: Self-pay | Admitting: *Deleted

## 2015-04-12 NOTE — Telephone Encounter (Signed)
Refill sent with 5 refills on 02/20/15.  Pharmacy asking for a 90-day refill.  This is a scheduled drug.  Will leave the rx as is.  Pharmacist verbalized understanding.

## 2015-04-13 ENCOUNTER — Other Ambulatory Visit: Payer: Self-pay | Admitting: Licensed Clinical Social Worker

## 2015-04-13 DIAGNOSIS — E349 Endocrine disorder, unspecified: Secondary | ICD-10-CM

## 2015-04-13 MED ORDER — TESTOSTERONE ENANTHATE 200 MG/ML IM SOLN: 200.0000 mg | INTRAMUSCULAR | Status: DC

## 2015-04-22 ENCOUNTER — Other Ambulatory Visit: Payer: Self-pay | Admitting: *Deleted

## 2015-04-22 DIAGNOSIS — E349 Endocrine disorder, unspecified: Secondary | ICD-10-CM

## 2015-04-22 DIAGNOSIS — B20 Human immunodeficiency virus [HIV] disease: Secondary | ICD-10-CM

## 2015-04-22 MED ORDER — TESTOSTERONE ENANTHATE 200 MG/ML IM SOLN
200.0000 mg | INTRAMUSCULAR | Status: DC
Start: 2015-04-22 — End: 2017-02-27

## 2015-04-22 MED ORDER — TESTOSTERONE ENANTHATE 200 MG/ML IM SOLN: 200.0000 mg | INTRAMUSCULAR | Status: DC

## 2015-04-22 NOTE — Telephone Encounter (Signed)
Insurance requesting 90-day refill for specialty rx

## 2015-05-18 ENCOUNTER — Other Ambulatory Visit: Payer: BLUE CROSS/BLUE SHIELD

## 2015-05-18 DIAGNOSIS — B20 Human immunodeficiency virus [HIV] disease: Secondary | ICD-10-CM

## 2015-05-18 LAB — CBC WITH DIFFERENTIAL/PLATELET
BASOS ABS: 0 10*3/uL (ref 0.0–0.1)
Basophils Relative: 0 % (ref 0–1)
EOS ABS: 0.1 10*3/uL (ref 0.0–0.7)
Eosinophils Relative: 1 % (ref 0–5)
HCT: 47.7 % (ref 39.0–52.0)
Hemoglobin: 16 g/dL (ref 13.0–17.0)
Lymphocytes Relative: 45 % (ref 12–46)
Lymphs Abs: 3.2 10*3/uL (ref 0.7–4.0)
MCH: 28 pg (ref 26.0–34.0)
MCHC: 33.5 g/dL (ref 30.0–36.0)
MCV: 83.5 fL (ref 78.0–100.0)
MPV: 9.1 fL (ref 8.6–12.4)
Monocytes Absolute: 0.6 10*3/uL (ref 0.1–1.0)
Monocytes Relative: 9 % (ref 3–12)
NEUTROS ABS: 3.2 10*3/uL (ref 1.7–7.7)
NEUTROS PCT: 45 % (ref 43–77)
PLATELETS: 220 10*3/uL (ref 150–400)
RBC: 5.71 MIL/uL (ref 4.22–5.81)
RDW: 14.5 % (ref 11.5–15.5)
WBC: 7.1 10*3/uL (ref 4.0–10.5)

## 2015-05-18 LAB — COMPLETE METABOLIC PANEL WITH GFR
ALK PHOS: 66 U/L (ref 39–117)
ALT: <8 U/L (ref 0–53)
AST: 12 U/L (ref 0–37)
Albumin: 3.8 g/dL (ref 3.5–5.2)
BILIRUBIN TOTAL: 0.3 mg/dL (ref 0.2–1.2)
BUN: 14 mg/dL (ref 6–23)
CO2: 24 mEq/L (ref 19–32)
CREATININE: 1.09 mg/dL (ref 0.50–1.35)
Calcium: 8.8 mg/dL (ref 8.4–10.5)
Chloride: 104 mEq/L (ref 96–112)
GFR, Est African American: >89 mL/min
GFR, Est Non African American: 81 mL/min
GLUCOSE: 85 mg/dL (ref 70–99)
Potassium: 4.3 mEq/L (ref 3.5–5.3)
Sodium: 141 mEq/L (ref 135–145)
Total Protein: 5.9 g/dL — ABNORMAL LOW (ref 6.0–8.3)

## 2015-05-19 LAB — HIV-1 RNA QUANT-NO REFLEX-BLD
HIV 1 RNA Quant: 69 copies/mL — ABNORMAL HIGH (ref <20)
HIV-1 RNA QUANT, LOG: 1.84 {Log} — AB (ref <1.30)

## 2015-05-19 LAB — T-HELPER CELL (CD4) - (RCID CLINIC ONLY)
CD4 % Helper T Cell: 34 % (ref 33–55)
CD4 T CELL ABS: 1170 /uL (ref 400–2700)

## 2015-06-01 ENCOUNTER — Ambulatory Visit (INDEPENDENT_AMBULATORY_CARE_PROVIDER_SITE_OTHER): Payer: BLUE CROSS/BLUE SHIELD | Admitting: Internal Medicine

## 2015-06-01 ENCOUNTER — Encounter: Payer: Self-pay | Admitting: Internal Medicine

## 2015-06-01 VITALS — BP 121/76 | HR 58 | Temp 98.2°F | Wt 196.0 lb

## 2015-06-01 DIAGNOSIS — Z21 Asymptomatic human immunodeficiency virus [HIV] infection status: Secondary | ICD-10-CM | POA: Diagnosis not present

## 2015-06-01 DIAGNOSIS — B20 Human immunodeficiency virus [HIV] disease: Secondary | ICD-10-CM

## 2015-06-01 DIAGNOSIS — B372 Candidiasis of skin and nail: Secondary | ICD-10-CM | POA: Diagnosis not present

## 2015-06-01 DIAGNOSIS — E291 Testicular hypofunction: Secondary | ICD-10-CM | POA: Diagnosis not present

## 2015-06-01 DIAGNOSIS — E349 Endocrine disorder, unspecified: Secondary | ICD-10-CM

## 2015-06-01 NOTE — Progress Notes (Signed)
Patient ID: Tyler Leonard, male   DOB: 1971-02-04, 44 y.o.   MRN: [id number]       Patient ID: Tyler Leonard, male   DOB: 1971/04/30, 44 y.o.   MRN: [id number]  HPI  44yo M wihth HIV disease, CD 4 count of 1170/VL 68 on stribild. Since last visit, he discontinued taking supplements and nasal sprays as he was concerned for drug interaction with HIV meds that contributed to his low level viremia  He has also noticed a candidal/yeast infection inner thighs he has been treating with lotrimin lotion which has worked significantly  Outpatient Encounter Prescriptions as of 06/01/2015  Medication Sig  . cetirizine (ZYRTEC) 10 MG tablet Take 10 mg by mouth daily.  Marland Kitchen elvitegravir-cobicistat-emtricitabine-tenofovir (STRIBILD) 150-150-200-300 MG TABS tablet Take 1 tablet by mouth daily with breakfast.  . flunisolide (NASALIDE) 25 MCG/ACT (0.025%) SOLN Place 2 sprays into the nose 2 (two) times daily.  Marland Kitchen ketoconazole (NIZORAL) 2 % shampoo Apply 1 application topically 2 (two) times a week.  . SYRINGE-NEEDLE, DISP, 3 ML 21G X 1" 3 ML MISC To draw up and inject testosterone intramuscularly as directed.  . testosterone enanthate (DELATESTRYL) 200 MG/ML injection Inject 1 mL (200 mg total) into the muscle every 14 (fourteen) days. For IM use only  . traZODone (DESYREL) 50 MG tablet TAKE 1 TABLET (50 MG TOTAL) BY MOUTH AT BEDTIME AS NEEDED FOR SLEEP.  Marland Kitchen VIAGRA 50 MG tablet TAKE 1 TABLET BY MOUTH EVERY DAY AS NEEDED FOR ED  . Biotin 1000 MCG tablet Take 1,000 mcg by mouth 3 (three) times daily.  . folic acid (FOLVITE) 400 MCG tablet Take 400 mcg by mouth daily.  Chilton Si Tea, Camillia sinensis, (SM GREEN TEA COMPLEX PO) Take 1 capsule by mouth daily.  . Misc Natural Products (PUMPKIN SEED OIL) CAPS Take 1 capsule by mouth 2 (two) times daily.  . Multiple Vitamin (MULTIVITAMIN) tablet Take 1 tablet by mouth daily.  Marland Kitchen pyridOXINE (VITAMIN B-6) 100 MG tablet Take 100 mg by mouth daily.  . Saw Palmetto 450 MG CAPS  Take 2 capsules by mouth 2 (two) times daily.  . vitamin A 56213 UNIT capsule Take 10,000 Units by mouth daily.  . vitamin B-12 (CYANOCOBALAMIN) 500 MCG tablet Take 500 mcg by mouth daily.  . vitamin C (ASCORBIC ACID) 500 MG tablet Take 500 mg by mouth daily.  . vitamin E 400 UNIT capsule Take 400 Units by mouth daily.  Marland Kitchen zinc gluconate 50 MG tablet Take 50 mg by mouth daily.   No facility-administered encounter medications on file as of 06/01/2015.     Patient Active Problem List   Diagnosis Date Noted  . 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 Hair loss 10/15/2014  . Insomnia 10/15/2014  . Allergic rhinitis 08/27/2014  . HIV (human immunodeficiency virus infection) 07/15/2012  . Erectile dysfunction 06/25/2012     There are no preventive care reminders to display for this patient.   Review of Systems 10 point ros is negative except fro what is mentioned in hpi Physical Exam   BP 121/76 mmHg  Pulse 58  Temp(Src) 98.2 F (36.8 C) (Oral)  Wt 196 lb (88.905 kg)   Constitutional: He is oriented to person, place, and time. He appears well-developed and well-nourished. No distress.  HENT:  Mouth/Throat: Oropharynx is clear and moist. No oropharyngeal exudate.  Cardiovascular: Normal rate, regular rhythm and normal heart sounds. Exam reveals no gallop and no friction rub.  No murmur heard.  Pulmonary/Chest: Effort  normal and breath sounds normal. No respiratory distress. He has no wheezes.  Abdominal: Soft. Bowel sounds are normal. He exhibits no distension. There is no tenderness.  Lymphadenopathy:  He has no cervical adenopathy.  Neurological: He is alert and oriented to person, place, and time.  Skin: Skin is warm and dry. No rash noted. No erythema.  Psychiatric: He has a normal mood and affect. His behavior is normal.    Lab Results  Component Value Date   CD4TCELL 34 05/18/2015   Lab Results  Component Value Date   CD4TABS 1170 05/18/2015   CD4TABS 1050 02/09/2015    CD4TABS 1170 11/02/2014   Lab Results  Component Value Date   HIV1RNAQUANT 69* 05/18/2015   Lab Results  Component Value Date   HEPBSAB POS* 06/25/2012   No results found for: RPR  CBC Lab Results  Component Value Date   WBC 7.1 05/18/2015   RBC 5.71 05/18/2015   HGB 16.0 05/18/2015   HCT 47.7 05/18/2015   PLT 220 05/18/2015   MCV 83.5 05/18/2015   MCH 28.0 05/18/2015   MCHC 33.5 05/18/2015   RDW 14.5 05/18/2015   LYMPHSABS 3.2 05/18/2015   MONOABS 0.6 05/18/2015   EOSABS 0.1 05/18/2015   BASOSABS 0.0 05/18/2015   BMET Lab Results  Component Value Date   NA 141 05/18/2015   K 4.3 05/18/2015   CL 104 05/18/2015   CO2 24 05/18/2015   GLUCOSE 85 05/18/2015   BUN 14 05/18/2015   CREATININE 1.09 05/18/2015   CALCIUM 8.8 05/18/2015   GFRNONAA 81 05/18/2015   GFRAA >89 05/18/2015     Assessment and Plan  hiv disease = low level viremia in the last 6 months, < 200 presently. Agree with not taking OTC supplements to minimize risk of drug interaction. Will continue to follow viral load at next appointment. No need to check CD 4 count since > 500, for the next 12 months.  Hypogonadism/testosterone deficiency = continue with testorone injections  Candidal rash = continue with topical antifungal cream to treat as needed. Wear loose clothing to minimize perspiration and dampness.  Health maintenance = will get flu shot in early fall

## 2015-06-07 ENCOUNTER — Other Ambulatory Visit: Payer: Self-pay | Admitting: *Deleted

## 2015-06-07 DIAGNOSIS — B20 Human immunodeficiency virus [HIV] disease: Secondary | ICD-10-CM

## 2015-06-07 MED ORDER — ELVITEG-COBIC-EMTRICIT-TENOFDF 150-150-200-300 MG PO TABS: 1.0000 | ORAL_TABLET | Freq: Every day | ORAL | Status: DC

## 2015-08-04 ENCOUNTER — Other Ambulatory Visit: Payer: BLUE CROSS/BLUE SHIELD

## 2015-08-04 DIAGNOSIS — B20 Human immunodeficiency virus [HIV] disease: Secondary | ICD-10-CM

## 2015-08-04 LAB — CBC WITH DIFFERENTIAL/PLATELET
Basophils Absolute: 0 10*3/uL (ref 0.0–0.1)
Basophils Relative: 0 % (ref 0–1)
EOS ABS: 0.1 10*3/uL (ref 0.0–0.7)
EOS PCT: 1 % (ref 0–5)
HCT: 47.2 % (ref 39.0–52.0)
Hemoglobin: 16 g/dL (ref 13.0–17.0)
LYMPHS ABS: 3.6 10*3/uL (ref 0.7–4.0)
Lymphocytes Relative: 49 % — ABNORMAL HIGH (ref 12–46)
MCH: 27.6 pg (ref 26.0–34.0)
MCHC: 33.9 g/dL (ref 30.0–36.0)
MCV: 81.5 fL (ref 78.0–100.0)
MPV: 9.4 fL (ref 8.6–12.4)
Monocytes Absolute: 0.6 10*3/uL (ref 0.1–1.0)
Monocytes Relative: 8 % (ref 3–12)
Neutro Abs: 3.1 10*3/uL (ref 1.7–7.7)
Neutrophils Relative %: 42 % — ABNORMAL LOW (ref 43–77)
Platelets: 230 10*3/uL (ref 150–400)
RBC: 5.79 MIL/uL (ref 4.22–5.81)
RDW: 14.2 % (ref 11.5–15.5)
WBC: 7.3 10*3/uL (ref 4.0–10.5)

## 2015-08-04 LAB — COMPREHENSIVE METABOLIC PANEL
ALT: 13 U/L (ref 9–46)
AST: 16 U/L (ref 10–40)
Albumin: 4.1 g/dL (ref 3.6–5.1)
Alkaline Phosphatase: 89 U/L (ref 40–115)
BUN: 17 mg/dL (ref 7–25)
CO2: 31 mmol/L (ref 20–31)
Calcium: 9.5 mg/dL (ref 8.6–10.3)
Chloride: 100 mmol/L (ref 98–110)
Creat: 1.14 mg/dL (ref 0.60–1.35)
GLUCOSE: 86 mg/dL (ref 65–99)
POTASSIUM: 4.1 mmol/L (ref 3.5–5.3)
Sodium: 134 mmol/L — ABNORMAL LOW (ref 135–146)
Total Bilirubin: 0.5 mg/dL (ref 0.2–1.2)
Total Protein: 6.7 g/dL (ref 6.1–8.1)

## 2015-08-05 LAB — HIV-1 RNA QUANT-NO REFLEX-BLD

## 2015-08-05 LAB — URINE CYTOLOGY ANCILLARY ONLY
CHLAMYDIA, DNA PROBE: NEGATIVE
Neisseria Gonorrhea: NEGATIVE

## 2015-08-05 NOTE — Addendum Note (Signed)
Addended by: Mariea Clonts D on: 08/05/2015 11:02 AM   Modules accepted: Orders

## 2015-08-06 LAB — T-HELPER CELL (CD4) - (RCID CLINIC ONLY)
CD4 % Helper T Cell: 34 % (ref 33–55)
CD4 T Cell Abs: 1270 /uL (ref 400–2700)

## 2015-09-01 ENCOUNTER — Ambulatory Visit: Payer: BLUE CROSS/BLUE SHIELD | Admitting: Internal Medicine

## 2015-12-13 ENCOUNTER — Other Ambulatory Visit: Payer: Self-pay | Admitting: Internal Medicine

## 2015-12-13 DIAGNOSIS — B20 Human immunodeficiency virus [HIV] disease: Secondary | ICD-10-CM

## 2015-12-15 ENCOUNTER — Other Ambulatory Visit: Payer: BLUE CROSS/BLUE SHIELD

## 2015-12-15 ENCOUNTER — Other Ambulatory Visit: Payer: Self-pay | Admitting: Internal Medicine

## 2015-12-15 DIAGNOSIS — J302 Other seasonal allergic rhinitis: Secondary | ICD-10-CM

## 2015-12-29 ENCOUNTER — Ambulatory Visit: Payer: BLUE CROSS/BLUE SHIELD | Admitting: Internal Medicine

## 2016-02-10 ENCOUNTER — Other Ambulatory Visit: Payer: Self-pay | Admitting: Internal Medicine

## 2016-02-10 DIAGNOSIS — B20 Human immunodeficiency virus [HIV] disease: Secondary | ICD-10-CM

## 2017-02-27 ENCOUNTER — Encounter: Payer: Self-pay | Admitting: Family Medicine

## 2017-02-27 ENCOUNTER — Ambulatory Visit (INDEPENDENT_AMBULATORY_CARE_PROVIDER_SITE_OTHER): Payer: BLUE CROSS/BLUE SHIELD | Admitting: Family Medicine

## 2017-02-27 VITALS — BP 118/72 | HR 84 | Temp 98.3°F | Wt 213.0 lb

## 2017-02-27 DIAGNOSIS — L03311 Cellulitis of abdominal wall: Secondary | ICD-10-CM | POA: Insufficient documentation

## 2017-02-27 MED ORDER — TRIAMCINOLONE ACETONIDE 0.1 % EX CREA
1.0000 | TOPICAL_CREAM | Freq: Two times a day (BID) | CUTANEOUS | 0 refills | Status: AC
Start: 2017-02-27 — End: 2018-02-27

## 2017-02-27 MED ORDER — DOXYCYCLINE HYCLATE 100 MG PO TABS: 100.0000 mg | ORAL_TABLET | Freq: Two times a day (BID) | ORAL | 0 refills | Status: DC

## 2017-02-27 NOTE — Progress Notes (Signed)
BP 118/72   Pulse 84   Temp 98.3 F (36.8 C) (Oral)   Wt 213 lb (96.6 kg)   BMI 27.85 kg/m    CC: tick bite Subjective:    Patient ID: Tyler Leonard, male    DOB: Jul 30, 1971, 46 y.o.   MRN: [id number]  HPI: Tyler Leonard is a 46 y.o. male presenting on 02/27/2017 for Insect Bite (tick bite-found 2 days ago on abd; red, swollen and itching)   2d ago found tick on abdomen - very itchy. Entire tick removed. Thinks tick was attached <1 day. Now over last 2 days noticing swelling and redness at site of bite.  No fevers/chills, nausea, joint pain, fatigue, headache, abd pain, or other rashes.   Relevant past medical, surgical, family and social history reviewed and updated as indicated. Interim medical history since our last visit reviewed. Allergies and medications reviewed and updated. Outpatient Medications Prior to Visit  Medication Sig Dispense Refill  . Biotin 1000 MCG tablet Take 1,000 mcg by mouth 3 (three) times daily.    . cetirizine (ZYRTEC) 10 MG tablet Take 10 mg by mouth daily.    . flunisolide (NASALIDE) 25 MCG/ACT (0.025%) SOLN PLACE 2 SPRAYS INTO THE NOSE 2 (TWO) TIMES DAILY. 25 mL 1  . folic acid (FOLVITE) 400 MCG tablet Take 400 mcg by mouth daily.    Chilton Si Tea, Camillia sinensis, (SM GREEN TEA COMPLEX PO) Take 1 capsule by mouth daily.    Marland Kitchen ketoconazole (NIZORAL) 2 % shampoo Apply 1 application topically 2 (two) times a week. 120 mL 0  . Misc Natural Products (PUMPKIN SEED OIL) CAPS Take 1 capsule by mouth 2 (two) times daily.    . Multiple Vitamin (MULTIVITAMIN) tablet Take 1 tablet by mouth daily.    Marland Kitchen pyridOXINE (VITAMIN B-6) 100 MG tablet Take 100 mg by mouth daily.    . Saw Palmetto 450 MG CAPS Take 2 capsules by mouth 2 (two) times daily.    . STRIBILD 150-150-200-300 MG TABS tablet TAKE ONE TABLET BY MOUTH ONCE DAILY WITH BREAKFAST AS INSTRUCTED. STORE IN ORIGINAL CONTAINER AT CONTROLLED ROOM TEMPERATURE. ([place]) 90 tablet 0  . SYRINGE-NEEDLE, DISP, 3 ML  21G X 1" 3 ML MISC To draw up and inject testosterone intramuscularly as directed. 50 each 0  . testosterone enanthate (DELATESTRYL) 200 MG/ML injection Inject 1 mL (200 mg total) into the muscle every 14 (fourteen) days. For IM use only 15 mL 1  . traZODone (DESYREL) 50 MG tablet TAKE 1 TABLET (50 MG TOTAL) BY MOUTH AT BEDTIME AS NEEDED FOR SLEEP. 90 tablet 0  . VIAGRA 50 MG tablet TAKE 1 TABLET BY MOUTH EVERY DAY AS NEEDED FOR ED 10 tablet 0  . vitamin A 40981 UNIT capsule Take 10,000 Units by mouth daily.    . vitamin B-12 (CYANOCOBALAMIN) 500 MCG tablet Take 500 mcg by mouth daily.    . vitamin C (ASCORBIC ACID) 500 MG tablet Take 500 mg by mouth daily.    . vitamin E 400 UNIT capsule Take 400 Units by mouth daily.    Marland Kitchen zinc gluconate 50 MG tablet Take 50 mg by mouth daily.     No facility-administered medications prior to visit.      Per HPI unless specifically indicated in ROS section below Review of Systems     Objective:    BP 118/72   Pulse 84   Temp 98.3 F (36.8 C) (Oral)   Wt 213 lb (96.6 kg)  BMI 27.85 kg/m   Wt Readings from Last 3 Encounters:  02/27/17 213 lb (96.6 kg)  06/01/15 196 lb (88.9 kg)  03/02/15 188 lb (85.3 kg)    Physical Exam  Constitutional: He appears well-developed and well-nourished. No distress.  Skin: Skin is warm and dry. Rash noted. There is erythema.  No residual embedded tick parts. Warm erythematous macular rash circumferentially around tick bite with further edema surrounding bite  Nursing note and vitals reviewed.     Assessment & Plan:   Problem List Items Addressed This Visit    Abdominal wall cellulitis - Primary    Abd wall cellulitis vs localized reaction to rash.  After tick bite. Not consistent with lyme disease.  Regardless will cover with doxy 10d course and TCI cream for localized rash.  Red flags to update [place] for further eval/treatment discussed. Pt agrees with plan.           Follow up plan: Return if symptoms  worsen or fail to improve.  Eustaquio Boyden, MD

## 2017-02-27 NOTE — Assessment & Plan Note (Signed)
Abd wall cellulitis vs localized reaction to rash.  After tick bite. Not consistent with lyme disease.  Regardless will cover with doxy 10d course and TCI cream for localized rash.  Red flags to update [place] for further eval/treatment discussed. Pt agrees with plan.

## 2017-02-27 NOTE — Patient Instructions (Signed)
I think you have localized reaction to a tick bite, possible cellulitis.  We will treat with doxycycline and triamcinolone steroid cream to area.    Tick Bite Information, Adult Ticks are insects that draw blood for food. Most ticks live in shrubs and grassy areas. They climb onto people and animals that brush against the leaves and grasses that they rest on. Then they bite, attaching themselves to the skin. Most ticks are harmless, but some ticks carry germs that can spread to a person through a bite and cause a disease. To reduce your risk of getting a disease from a tick bite, it is important to take steps to prevent tick bites. It is also important to check for ticks after being outdoors. If you find that a tick has attached to you, watch for symptoms of disease. How can I prevent tick bites? Take these steps to help prevent tick bites when you are outdoors in an area where ticks are found:  Use insect repellent that has DEET (20% or higher), picaridin, or IR3535 in it. Use it on:  Skin that is showing.  The top of your boots.  Your pant legs.  Your sleeve cuffs.  For repellent products that contain permethrin, follow product instructions. Use these products on:  Clothing.  Gear.  Boots.  Tents.  Wear protective clothing. Long sleeves and long pants offer the best protection from ticks.  Wear light-colored clothing so you can see ticks more easily.  Tuck your pant legs into your socks.  If you go walking on a trail, stay in the middle of the trail so your skin, hair, and clothing do not touch the bushes.  Avoid walking through areas with long grass.  Check for ticks on your clothing, hair, and skin often while you are outside, and check again before you go inside. Make sure to check the places that ticks attach themselves most often. These places include the scalp, neck, armpits, waist, groin, and joint areas. Ticks that carry a disease called Lyme disease have to be  attached to the skin for 24-48 hours. Checking for ticks every day will lessen your risk of this and other diseases.  When you come indoors, wash your clothes and take a shower or a bath right away. Dry your clothes in a dryer on high heat for at least 60 minutes. This will kill any ticks in your clothes. What is the proper way to remove a tick? If you find a tick on your body, remove it as soon as possible. Removing a tick sooner rather than later can prevent germs from passing from the tick to your body. To remove a tick that is crawling on your skin but has not bitten:  Go outdoors and brush the tick off.  Remove the tick with tape or a lint roller. To remove a tick that is attached to your skin:  Wash your hands.  If you have latex gloves, put them on.  Use tweezers, curved forceps, or a tick-removal tool to gently grasp the tick as close to your skin and the tick's head as possible.  Gently pull with steady, upward pressure until the tick lets go. When removing the tick:  Take care to keep the tick's head attached to its body.  Do not twist or jerk the tick. This can make the tick's head or mouth break off.  Do not squeeze or crush the tick's body. This could force disease-carrying fluids from the tick into your body. Do  not try to remove a tick with heat, alcohol, petroleum jelly, or fingernail polish. Using these methods can cause the tick to salivate and regurgitate into your bloodstream, increasing your risk of getting a disease. What should I do after removing a tick?  Clean the bite area with soap and water, rubbing alcohol, or an iodine scrub.  If an antiseptic cream or ointment is available, apply a small amount to the bite site.  Wash and disinfect any instruments that you used to remove the tick. How should I dispose of a tick? To dispose of a live tick, use one of these methods:  Place it in rubbing alcohol.  Place it in a sealed bag or container.  Wrap it  tightly in tape.  Flush it down the toilet. Contact a health care provider if:  You have symptoms of a disease after a tick bite. Symptoms of a tick-borne disease can occur from moments after the tick bites to up to 30 days after a tick is removed. Symptoms include:  Muscle, joint, or bone pain.  Difficulty walking or moving your legs.  Numbness in the legs.  Paralysis.  Red rash around the tick bite area that is shaped like a target or a "bull's-eye."  Redness and swelling in the area of the tick bite.  Fever.  Repeated vomiting.  Diarrhea.  Weight loss.  Tender, swollen lymph glands.  Shortness of breath.  Cough.  Pain in the abdomen.  Headache.  Abnormal tiredness.  A change in your level of consciousness.  Confusion. Get help right away if:  You are not able to remove a tick.  A part of a tick breaks off and gets stuck in your skin.  Your symptoms get worse. Summary  Ticks may carry germs that can spread to a person through a bite and cause disease.  Wear protective clothing and use insect repellent to prevent tick bites. Follow product instructions.  If you find a tick on your body, remove it as soon as possible. If the tick is attached, do not try to remove with heat, alcohol, petroleum jelly, or fingernail polish.  Remove the attached tick using tweezers, curved forceps, or a tick-removal tool. Gently pull with steady, upward pressure until the tick lets go. Do not twist or jerk the tick. Do not squeeze or crush the tick's body.  If you have symptoms after being bitten by a tick, contact a health care provider. This information is not intended to replace advice given to you by your health care provider. Make sure you discuss any questions you have with your health care provider. Document Released: 10/20/2000 Document Revised: 08/04/2016 Document Reviewed: 08/04/2016 Elsevier Interactive Patient Education  2017 [employer].

## 2017-02-27 NOTE — Progress Notes (Signed)
Pre visit review using our clinic review tool, if applicable. No additional management support is needed unless otherwise documented below in the visit note. 

## 2017-12-02 ENCOUNTER — Ambulatory Visit (HOSPITAL_COMMUNITY)
Admission: EM | Admit: 2017-12-02 | Discharge: 2017-12-02 | Disposition: A | Discharge status: Home / Self Care | Payer: BLUE CROSS/BLUE SHIELD | Attending: Family Medicine | Admitting: Family Medicine

## 2017-12-02 ENCOUNTER — Encounter (HOSPITAL_COMMUNITY): Payer: Self-pay | Admitting: Emergency Medicine

## 2017-12-02 DIAGNOSIS — Z21 Asymptomatic human immunodeficiency virus [HIV] infection status: Secondary | ICD-10-CM | POA: Insufficient documentation

## 2017-12-02 DIAGNOSIS — Z87891 Personal history of nicotine dependence: Secondary | ICD-10-CM | POA: Insufficient documentation

## 2017-12-02 DIAGNOSIS — B356 Tinea cruris: Secondary | ICD-10-CM | POA: Insufficient documentation

## 2017-12-02 DIAGNOSIS — N529 Male erectile dysfunction, unspecified: Secondary | ICD-10-CM | POA: Insufficient documentation

## 2017-12-02 DIAGNOSIS — N12 Tubulo-interstitial nephritis, not specified as acute or chronic: Secondary | ICD-10-CM

## 2017-12-02 LAB — POCT URINALYSIS DIP (DEVICE)
BILIRUBIN URINE: NEGATIVE
Glucose, UA: NEGATIVE mg/dL
NITRITE: POSITIVE — AB
PH: 6 (ref 5.0–8.0)
Protein, ur: >=300 mg/dL — AB
Specific Gravity, Urine: >=1.03 (ref 1.005–1.030)
Urobilinogen, UA: 1 mg/dL (ref 0.0–1.0)

## 2017-12-02 LAB — POCT I-STAT, CHEM 8
BUN: 15 mg/dL (ref 6–20)
CHLORIDE: 100 mmol/L — AB (ref 101–111)
CREATININE: 1.1 mg/dL (ref 0.61–1.24)
Calcium, Ion: 1.12 mmol/L — ABNORMAL LOW (ref 1.15–1.40)
Glucose, Bld: 108 mg/dL — ABNORMAL HIGH (ref 65–99)
HEMATOCRIT: 47 % (ref 39.0–52.0)
Hemoglobin: 16 g/dL (ref 13.0–17.0)
Potassium: 3.8 mmol/L (ref 3.5–5.1)
Sodium: 137 mmol/L (ref 135–145)
TCO2: 27 mmol/L (ref 22–32)

## 2017-12-02 MED ORDER — CEFTRIAXONE SODIUM 1 G IJ SOLR
1.0000 g | Freq: Once | INTRAMUSCULAR | Status: AC
  Administered 2017-12-02: 1 g via INTRAMUSCULAR

## 2017-12-02 MED ORDER — CIPROFLOXACIN HCL 500 MG PO TABS: 500.0000 mg | ORAL_TABLET | Freq: Two times a day (BID) | ORAL | 0 refills | Status: AC

## 2017-12-02 MED ORDER — CEFTRIAXONE SODIUM 1 G IJ SOLR
INTRAMUSCULAR | Status: AC
  Filled 2017-12-02: qty 10

## 2017-12-02 MED ORDER — LIDOCAINE HCL (PF) 1 % IJ SOLN
INTRAMUSCULAR | Status: AC
  Filled 2017-12-02: qty 2

## 2017-12-02 MED ORDER — PHENAZOPYRIDINE HCL 200 MG PO TABS: 200.0000 mg | ORAL_TABLET | Freq: Three times a day (TID) | ORAL | 0 refills | Status: DC

## 2017-12-02 NOTE — ED Triage Notes (Signed)
PT reports cloudy urine for a while. PT reports right flank pain since Thursday. PT reports dysuria that started Thursday as well.

## 2017-12-02 NOTE — ED Provider Notes (Signed)
[facility]    CSN: [id number] Arrival date & time: 12/02/17  1530     History   Chief Complaint Chief Complaint  Patient presents with  . Dysuria    HPI Tyler Leonard is a 47 y.o. male.   Kimball presents with complaints of right flank pain which has been associated with burning urgency and frequency of urination which started 1/24. States has noticed he has had cloudy and odor to urine for "a while."  started taking ketoconazole three days ago for jock itch as well as topical cream. States has had some penile discharge which is brown in color. Without lesions. He has not been sexually active since last summer at time of syphilis diagnosis. Completed treatment for syphilis, had non reactive testing with ID 1/8. History of HIV. VL 1/8 was 20.7. Takes genvoya daily and follows regularly with ID. No known fevers. Pelvic pain with urination. Pain radiates from flank to abdomen. No known blood to urine. Has been drinking fluids. States had a UTI many years ago. Otherwise without history of UTIs, kidney stones or kidney concerns. Tried taking Azo cranberry which did not help. Without nausea, vomiting, diarrhea.    ROS per HPI.       Past Medical History:  Diagnosis Date  . Allergy   . HIV infection [facility])     Patient Active Problem List   Diagnosis Date Noted  . Abdominal wall cellulitis 02/27/2017  . 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 Hair loss 10/15/2014  . Insomnia 10/15/2014  . Allergic rhinitis 08/27/2014  . HIV (human immunodeficiency virus infection) (HCC) 07/15/2012  . Erectile dysfunction 06/25/2012    History reviewed. No pertinent surgical history.     Home Medications    Prior to Admission medications   Medication Sig Start Date End Date Taking? Authorizing Provider  elvitegravir-cobicistat-emtricitabine-tenofovir (GENVOYA) 150-150-200-10 MG TABS tablet Take 1 tablet by mouth daily with breakfast.   Yes [provider]  ketoconazole  (NIZORAL) 200 MG tablet Take by mouth daily.   Yes [provider]  ciprofloxacin (CIPRO) 500 MG tablet Take 1 tablet (500 mg total) by mouth every 12 (twelve) hours for 10 days. 12/02/17 12/12/17  Georgetta Haber, NP  phenazopyridine (PYRIDIUM) 200 MG tablet Take 1 tablet (200 mg total) by mouth 3 (three) times daily. 12/02/17   Linus Mako B, NP  triamcinolone cream (KENALOG) 0.1 % Apply 1 application topically 2 (two) times daily. Apply to AA. 02/27/17 02/27/18  Eustaquio Boyden, MD    Family History Family History  Problem Relation Age of Onset  . Alcohol abuse Mother   . Drug abuse Mother   . Cancer Mother        uterine  . Hyperlipidemia Mother   . Hypertension Mother   . Mental illness Mother   . Heart disease Maternal Grandmother   . Arthritis Paternal Grandmother   . Hypertension Paternal Grandmother     Social History Social History   Tobacco Use  . Smoking status: Former Smoker    Packs/day: 0.50    Years: 25.00    Pack years: 12.50    Types: Cigarettes    Last attempt to quit: 12/12/2012    Years since quitting: 4.9  . Smokeless tobacco: Never Used  Substance Use Topics  . Alcohol use: Yes    Alcohol/week: 4.8 oz    Types: 8 Standard drinks or equivalent per week    Comment: rare beer drinker   . Drug use: No  Allergies   Patient has no known allergies.   Review of Systems Review of Systems   Physical Exam Triage Vital Signs ED Triage Vitals  Enc Vitals Group     BP 12/02/17 1640 134/90     Pulse Rate 12/02/17 1640 90     Resp 12/02/17 1640 18     Temp 12/02/17 1640 98.8 F (37.1 C)     Temp Source 12/02/17 1640 Oral     SpO2 12/02/17 1640 99 %     Weight 12/02/17 1642 190 lb (86.2 kg)     Height --      Head Circumference --      Peak Flow --      Pain Score --      Pain Loc --      Pain Edu? --      Excl. in GC? --    No data found.  Updated Vital Signs BP 134/90 (BP Location: Left Arm)   Pulse 90   Temp 98.8 F (37.1  C) (Oral)   Resp 18   Wt 190 lb (86.2 kg)   SpO2 99%   BMI 24.84 kg/m   Visual Acuity Right Eye Distance:   Left Eye Distance:   Bilateral Distance:    Right Eye Near:   Left Eye Near:    Bilateral Near:     Physical Exam  Constitutional: He is oriented to person, place, and time. He appears well-developed and well-nourished.  Cardiovascular: Normal rate and regular rhythm.  Pulmonary/Chest: Effort normal and breath sounds normal.  Abdominal: Soft. Bowel sounds are normal. There is CVA tenderness.  Non tender to suprapubic abdomen   Neurological: He is alert and oriented to person, place, and time.  Skin: Skin is warm and dry.     UC Treatments / Results  Labs (all labs ordered are listed, but only abnormal results are displayed) Labs Reviewed  POCT URINALYSIS DIP (DEVICE) - Abnormal; Notable for the following components:      Result Value   Ketones, ur TRACE (*)    Hgb urine dipstick LARGE (*)    Protein, ur >=300 (*)    Nitrite POSITIVE (*)    Leukocytes, UA LARGE (*)    All other components within normal limits  POCT I-STAT, CHEM 8 - Abnormal; Notable for the following components:   Chloride 100 (*)    Glucose, Bld 108 (*)    Calcium, Ion 1.12 (*)    All other components within normal limits  URINE CULTURE    EKG  EKG Interpretation None       Radiology No results found.  Procedures Procedures (including critical care time)  Medications Ordered in UC Medications  cefTRIAXone (ROCEPHIN) injection 1 g (not administered)     Initial Impression / Assessment and Plan / UC Course  I have reviewed the triage vital signs and the nursing notes.  Pertinent labs & imaging results that were available during my care of the patient were reviewed by me and considered in my medical decision making (see chart for details).     Tolerating oral intake. Without tachycardia. Afebrile. Symptoms and urine dip consistent with pylo. Rocephin given today, cipro  started. Urine sent for culture. Return precautions provided. Has not been sexually active. Recommended follow up with ID for recheck.   Case discussed with supervising physician Dr. Aaron MoseLaunstein.   Final Clinical Impressions(s) / UC Diagnoses   Final diagnoses:  Pyelonephritis    ED Discharge Orders  Ordered    ciprofloxacin (CIPRO) 500 MG tablet  Every 12 hours     12/02/17 1713    phenazopyridine (PYRIDIUM) 200 MG tablet  3 times daily     12/02/17 1715       Controlled Substance Prescriptions [place] Controlled Substance Registry consulted? Not Applicable   Georgetta Haber, NP 12/02/17 1725

## 2017-12-02 NOTE — Discharge Instructions (Signed)
Continue to drink plenty of fluids to ensure adequate hydration and empty bladder regularly. I have sent your urine for culture to ensure sensitivity to the antibiotic I have started. We will call you if any medication changes are needed.  You may take ibuprofen or aleve as needed for pain. Pyridium for burning with urination.  If you develop worsening pain, fevers, abdominal pain, blood to urine, nausea, vomiting or otherwise worsening please go to Er.  Please follow up with your ID physician for recheck to ensure improving.

## 2017-12-04 LAB — URINE CULTURE: Culture: >=100000 — AB

## 2018-08-31 ENCOUNTER — Ambulatory Visit (HOSPITAL_COMMUNITY)
Admission: EM | Admit: 2018-08-31 | Discharge: 2018-08-31 | Disposition: A | Discharge status: Home / Self Care | Payer: Self-pay | Attending: Family Medicine | Admitting: Family Medicine

## 2018-08-31 ENCOUNTER — Encounter (HOSPITAL_COMMUNITY): Payer: Self-pay | Admitting: Emergency Medicine

## 2018-08-31 ENCOUNTER — Other Ambulatory Visit: Payer: Self-pay

## 2018-08-31 DIAGNOSIS — M546 Pain in thoracic spine: Secondary | ICD-10-CM

## 2018-08-31 MED ORDER — CYCLOBENZAPRINE HCL 5 MG PO TABS: 5.0000 mg | ORAL_TABLET | Freq: Every evening | ORAL | 0 refills | Status: DC | PRN

## 2018-08-31 MED ORDER — PREDNISONE 50 MG PO TABS: 50.0000 mg | ORAL_TABLET | Freq: Every day | ORAL | 0 refills | Status: DC

## 2018-08-31 NOTE — Discharge Instructions (Signed)
Start prednisone as directed. Flexeril as needed at night. Flexeril can make you drowsy, so do not take if you are going to drive, operate heavy machinery, or make important decisions. Ice/heat compresses as needed. This can take up to 3-4 weeks to completely resolve, but you should be feeling better each week. Follow up with PCP for further evaluation if symptoms not improving.

## 2018-08-31 NOTE — ED Provider Notes (Signed)
[facility]    CSN: [id number] Arrival date & time: 08/31/18  1254     History   Chief Complaint Chief Complaint  Patient presents with  . Back Pain    HPI Tyler Leonard is a 47 y.o. male.   47 year old male comes in for 1 to 2-week history of right upper back pain.  States he felt like he "slept the wrong way".  He woke up with the pain that was mild at first, and has been gradually worsening.  Now with shooting pains down the arm, with numbness and tingling to the fingers.  States if he keeps his arm elevated, the pain resolves.  Otherwise, pain with range of motion.  He denies injury/trauma.  Denies neck pain/neck injury.  Denies chest pain, shortness of breath, weakness, dizziness.  Has tried topical lidocaine, aspirin, BenGay, TENS unit without relief.     Past Medical History:  Diagnosis Date  . Allergy   . HIV infection [facility])     Patient Active Problem List   Diagnosis Date Noted  . Abdominal wall cellulitis 02/27/2017  . 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 Hair loss 10/15/2014  . Insomnia 10/15/2014  . Allergic rhinitis 08/27/2014  . HIV (human immunodeficiency virus infection) (HCC) 07/15/2012  . Erectile dysfunction 06/25/2012    History reviewed. No pertinent surgical history.     Home Medications    Prior to Admission medications   Medication Sig Start Date End Date Taking? Authorizing Provider  cyclobenzaprine (FLEXERIL) 5 MG tablet Take 1 tablet (5 mg total) by mouth at bedtime as needed for muscle spasms. 08/31/18   Cathie Hoops, Amy V, PA-C  elvitegravir-cobicistat-emtricitabine-tenofovir (GENVOYA) 150-150-200-10 MG TABS tablet Take 1 tablet by mouth daily with breakfast.    [provider]  ketoconazole (NIZORAL) 200 MG tablet Take by mouth daily.    [provider]  phenazopyridine (PYRIDIUM) 200 MG tablet Take 1 tablet (200 mg total) by mouth 3 (three) times daily. 12/02/17   Georgetta Haber, NP  predniSONE (DELTASONE) 50 MG  tablet Take 1 tablet (50 mg total) by mouth daily. 08/31/18   Belinda Fisher, PA-C    Family History Family History  Problem Relation Age of Onset  . Alcohol abuse Mother   . Drug abuse Mother   . Cancer Mother        uterine  . Hyperlipidemia Mother   . Hypertension Mother   . Mental illness Mother   . Heart disease Maternal Grandmother   . Arthritis Paternal Grandmother   . Hypertension Paternal Grandmother     Social History Social History   Tobacco Use  . Smoking status: Former Smoker    Packs/day: 0.50    Years: 25.00    Pack years: 12.50    Types: Cigarettes    Last attempt to quit: 12/12/2012    Years since quitting: 5.7  . Smokeless tobacco: Never Used  Substance Use Topics  . Alcohol use: Yes    Alcohol/week: 8.0 standard drinks    Types: 8 Standard drinks or equivalent per week    Comment: rare beer drinker   . Drug use: No     Allergies   Patient has no known allergies.   Review of Systems Review of Systems  Reason unable to perform ROS: See HPI as above.     Physical Exam Triage Vital Signs ED Triage Vitals  Enc Vitals Group     BP 08/31/18 1336 (!) 152/92     Pulse Rate  08/31/18 1336 60     Resp 08/31/18 1336 18     Temp 08/31/18 1336 98 F (36.7 C)     Temp Source 08/31/18 1336 Oral     SpO2 08/31/18 1336 100 %     Weight --      Height --      Head Circumference --      Peak Flow --      Pain Score 08/31/18 1335 8     Pain Loc --      Pain Edu? --      Excl. in GC? --    No data found.  Updated Vital Signs BP (!) 152/92 (BP Location: Right Arm)   Pulse 60   Temp 98 F (36.7 C) (Oral)   Resp 18   SpO2 100%   Visual Acuity Right Eye Distance:   Left Eye Distance:   Bilateral Distance:    Right Eye Near:   Left Eye Near:    Bilateral Near:     Physical Exam  Constitutional: He is oriented to person, place, and time. He appears well-developed and well-nourished. No distress.  HENT:  Head: Normocephalic and atraumatic.    Eyes: Pupils are equal, round, and reactive to light. Conjunctivae are normal.  Neck: Normal range of motion. Neck supple. No spinous process tenderness and no muscular tenderness present. Normal range of motion present.  Cardiovascular: Normal rate, regular rhythm and normal heart sounds. Exam reveals no gallop and no friction rub.  No murmur heard. Pulmonary/Chest: Effort normal and breath sounds normal. No accessory muscle usage or stridor. No respiratory distress. He has no decreased breath sounds. He has no wheezes. He has no rhonchi. He has no rales.  Musculoskeletal:  No tenderness on palpation of the spinous processes.  Tenderness to palpation of thoracic paraspinal area, and parascapular area. Full range of motion. Strength normal and equal bilaterally.  Normal grip strength.  Sensation intact with 4/5 sensation of 1st to 4th finger intermittently. Radial pulses 2+ and equal bilaterally. Capillary refill less than 2 seconds.   Neurological: He is alert and oriented to person, place, and time.  Skin: Skin is warm and dry. He is not diaphoretic.     UC Treatments / Results  Labs (all labs ordered are listed, but only abnormal results are displayed) Labs Reviewed - No data to display  EKG None  Radiology No results found.  Procedures Procedures (including critical care time)  Medications Ordered in UC Medications - No data to display  Initial Impression / Assessment and Plan / UC Course  I have reviewed the triage vital signs and the nursing notes.  Pertinent labs & imaging results that were available during my care of the patient were reviewed by me and considered in my medical decision making (see chart for details).    Prednisone as directed.  Muscle relaxant as needed.  Return precautions given.  Patient expresses understanding and agrees to plan.  Final Clinical Impressions(s) / UC Diagnoses   Final diagnoses:  Acute right-sided thoracic back pain    ED  Prescriptions    Medication Sig Dispense Auth. Provider   predniSONE (DELTASONE) 50 MG tablet Take 1 tablet (50 mg total) by mouth daily. 5 tablet Yu, Amy V, PA-C   cyclobenzaprine (FLEXERIL) 5 MG tablet Take 1 tablet (5 mg total) by mouth at bedtime as needed for muscle spasms. 10 tablet Threasa Alpha, PA-C 08/31/18  1416  

## 2018-08-31 NOTE — ED Triage Notes (Signed)
The patient presented to the [facility] with a complaint of upper back pain x 2 weeks. The patient denied any known injury but stated that he "slept on it the wrong way."

## 2018-09-05 ENCOUNTER — Encounter: Payer: Self-pay | Admitting: Internal Medicine

## 2018-09-05 ENCOUNTER — Ambulatory Visit (INDEPENDENT_AMBULATORY_CARE_PROVIDER_SITE_OTHER)
Admission: RE | Admit: 2018-09-05 | Discharge: 2018-09-05 | Disposition: A | Discharge status: Home / Self Care | Payer: Medicaid Other | Source: Ambulatory Visit | Attending: Internal Medicine | Admitting: Internal Medicine

## 2018-09-05 ENCOUNTER — Ambulatory Visit: Payer: Self-pay | Admitting: Internal Medicine

## 2018-09-05 VITALS — BP 124/86 | HR 62 | Temp 98.2°F | Wt 213.0 lb

## 2018-09-05 DIAGNOSIS — M549 Dorsalgia, unspecified: Secondary | ICD-10-CM

## 2018-09-05 DIAGNOSIS — R202 Paresthesia of skin: Secondary | ICD-10-CM

## 2018-09-05 LAB — VITAMIN B12: Vitamin B-12: 188 pg/mL — ABNORMAL LOW (ref 211–911)

## 2018-09-05 LAB — VITAMIN D 25 HYDROXY (VIT D DEFICIENCY, FRACTURES): VITD: 23.35 ng/mL — ABNORMAL LOW (ref 30.00–100.00)

## 2018-09-05 LAB — TSH: TSH: 3.09 u[IU]/mL (ref 0.35–4.50)

## 2018-09-05 MED ORDER — CYCLOBENZAPRINE HCL 10 MG PO TABS
10.0000 mg | ORAL_TABLET | Freq: Three times a day (TID) | ORAL | 0 refills | Status: DC | PRN
Start: 2018-09-05 — End: 2023-11-16

## 2018-09-05 MED ORDER — PREDNISONE 10 MG PO TABS: ORAL_TABLET | ORAL | 0 refills | Status: DC

## 2018-09-05 NOTE — Progress Notes (Signed)
Subjective:    Patient ID: Tyler Leonard, male    DOB: [birth year], 47 y.o.   MRN: [id number]  HPI  Pt presents to the clinic today for UC Follow Up. He went to [facility] 10/26 with right upper back pain. He reports numbness and tingling in the right arm. He denies neck pain. No xray was performed. He was treated with Prednisone and Flexeril and reports the back pain has improved but the numbness and tingling still persist in the right arm. He denies any injury to the area. He is right hand dominant.  Review of Systems      Past Medical History:  Diagnosis Date  . Allergy   . HIV infection (HCC)     Current Outpatient Medications  Medication Sig Dispense Refill  . ARIPiprazole (ABILIFY) 20 MG tablet Take 20 mg by mouth daily.    Marland Kitchen BIKTARVY 50-200-25 MG TABS tablet Take 1 tablet by mouth daily.   5  . cyclobenzaprine (FLEXERIL) 10 MG tablet Take 1 tablet (10 mg total) by mouth 3 (three) times daily as needed for muscle spasms. 30 tablet 0  . predniSONE (DELTASONE) 10 MG tablet Take 3 tabs on days 1-3, take 2 tabs on days 4-6, take 1 tab on days 7-9 18 tablet 0   No current facility-administered medications for this visit.     No Known Allergies  Family History  Problem Relation Age of Onset  . Alcohol abuse Mother   . Drug abuse Mother   . Cancer Mother        uterine  . Hyperlipidemia Mother   . Hypertension Mother   . Mental illness Mother   . Heart disease Maternal Grandmother   . Arthritis Paternal Grandmother   . Hypertension Paternal Grandmother     Social History   Socioeconomic History  . Marital status: Married    Spouse name: Not on file  . Number of children: Not on file  . Years of education: Not on file  . Highest education level: Not on file  Occupational History  . Not on file  Social Needs  . Financial resource strain: Not on file  . Food insecurity:    Worry: Not on file    Inability: Not on file  . Transportation needs:    Medical: Not on file      Non-medical: Not on file  Tobacco Use  . Smoking status: Former Smoker    Packs/day: 0.50    Years: 25.00    Pack years: 12.50    Types: Cigarettes    Last attempt to quit: 12/12/2012    Years since quitting: 5.7  . Smokeless tobacco: Never Used  Substance and Sexual Activity  . Alcohol use: Yes    Alcohol/week: 8.0 standard drinks    Types: 8 Standard drinks or equivalent per week    Comment: rare beer drinker   . Drug use: No  . Sexual activity: Yes    Partners: Male    Birth control/protection: Condom    Comment: pt. declined condoms  Lifestyle  . Physical activity:    Days per week: Not on file    Minutes per session: Not on file  . Stress: Not on file  Relationships  . Social connections:    Talks on phone: Not on file    Gets together: Not on file    Attends religious service: Not on file    Active member of club or organization: Not on file  Attends meetings of clubs or organizations: Not on file    Relationship status: Not on file  . Intimate partner violence:    Fear of current or ex partner: Not on file    Emotionally abused: Not on file    Physically abused: Not on file    Forced sexual activity: Not on file  Other Topics Concern  . Not on file  Social History Narrative  . Not on file     Constitutional: Denies fever, malaise, fatigue, headache or abrupt weight changes.  Respiratory: Denies difficulty breathing, shortness of breath, cough or sputum production.   Cardiovascular: Denies chest pain, chest tightness, palpitations or swelling in the hands or feet.  Musculoskeletal: Pt reports right upper back pain. Denies decrease in range of motion, difficulty with gait, muscle pain or joint pain and swelling.  Skin: Denies redness, rashes, lesions or ulcercations.  Neurological: Pt reports numbness and tingling of right upper extremity. Denies dizziness, difficulty with memory, difficulty with speech or problems with balance and coordination.    No  other specific complaints in a complete review of systems (except as listed in HPI above).  Objective:   Physical Exam   BP 124/86   Pulse 62   Temp 98.2 F (36.8 C) (Oral)   Wt 213 lb (96.6 kg)   SpO2 98%   BMI 27.85 kg/m  Wt Readings from Last 3 Encounters:  09/05/18 213 lb (96.6 kg)  12/02/17 190 lb (86.2 kg)  02/27/17 213 lb (96.6 kg)    General: Appears his stated age, well developed, well nourished in NAD. Skin: Warm, dry and intact. No rashes noted. Cardiovascular: Radial pulse 2+. Musculoskeletal: Normal flexion, extension and rotation of the spine. No bony tenderness noted over the spine. Pain with palpation of the right parathoracic muscles. Strength 5/5 BUE. Hand grips equal. Neurological: Alert and oriented. Positive Tinel's. Negative Phalen's Coordination normal.    BMET    Component Value Date/Time   NA 137 12/02/2017 1713   NA 139 01/18/2012   K 3.8 12/02/2017 1713   CL 100 (L) 12/02/2017 1713   CO2 31 08/04/2015 0944   GLUCOSE 108 (H) 12/02/2017 1713   BUN 15 12/02/2017 1713   BUN 11 01/18/2012   CREATININE 1.10 12/02/2017 1713   CREATININE 1.14 08/04/2015 0944   CALCIUM 9.5 08/04/2015 0944   GFRNONAA 81 05/18/2015 0921   GFRAA >89 05/18/2015 0921    Lipid Panel     Component Value Date/Time   CHOL 164 12/25/2013 1024   TRIG 89 12/25/2013 1024   HDL 48 12/25/2013 1024   CHOLHDL 3.4 12/25/2013 1024   VLDL 18 12/25/2013 1024   LDLCALC 98 12/25/2013 1024    CBC    Component Value Date/Time   WBC 7.3 08/04/2015 0944   RBC 5.79 08/04/2015 0944   HGB 16.0 12/02/2017 1713   HCT 47.0 12/02/2017 1713   PLT 230 08/04/2015 0944   MCV 81.5 08/04/2015 0944   MCH 27.6 08/04/2015 0944   MCHC 33.9 08/04/2015 0944   RDW 14.2 08/04/2015 0944   LYMPHSABS 3.6 08/04/2015 0944   MONOABS 0.6 08/04/2015 0944   EOSABS 0.1 08/04/2015 0944   BASOSABS 0.0 08/04/2015 0944    Hgb A1C No results found for: HGBA1C         Assessment & Plan:   UC  Follow Up for Acute Right Upper Back Pain, Paresthesia of Right Upper Extremity:  UC notes reviewed Will repeat Pred Taper, lower dose for longer duration-  avoid OTC NSAID's Increase Flexeril to 10 mg TID prn- sedation caution given Encouraged heat and stretching Will check Vit D, B12, and TSH  Will follow up after labs and xray, return precautions discussed Nicki Reaper, NP

## 2018-09-05 NOTE — Patient Instructions (Signed)
Paresthesia Paresthesia is a burning or prickling feeling. This feeling can happen in any part of the body. It often happens in the hands, arms, legs, or feet. Usually, it is not painful. In most cases, the feeling goes away in a short time and is not a sign of a serious problem. Follow these instructions at home:  Avoid drinking alcohol.  Try massage or needle therapy (acupuncture) to help with your problems.  Keep all follow-up visits as told by your doctor. This is important. Contact a doctor if:  You keep on having episodes of paresthesia.  Your burning or prickling feeling gets worse when you walk.  You have pain or cramps.  You feel dizzy.  You have a rash. Get help right away if:  You feel weak.  You have trouble walking or moving.  You have problems speaking, understanding, or seeing.  You feel confused.  You cannot control when you pee (urinate) or poop (bowel movement).  You lose feeling (numbness) after an injury.  You pass out (faint). This information is not intended to replace advice given to you by your health care provider. Make sure you discuss any questions you have with your health care provider. Document Released: 10/05/2008 Document Revised: 03/30/2016 Document Reviewed: 10/19/2014 Elsevier Interactive Patient Education  2018 [employer]vier Inc[employer]  

## 2018-09-09 ENCOUNTER — Telehealth: Payer: Self-pay

## 2018-09-09 NOTE — Telephone Encounter (Addendum)
-----   Message from Lorre Munroe, NP sent at 09/06/2018  3:11 PM EDT ----- Call pt:  Xrays negative for acute findings.  Vitamin B12 low. I would recommend start monthly B12 injections. Vitamin D low. Start Vitamin D3 5000 units daily over the counter along with 1200 mg of Calcium. If numbness and tingling in hand persist, we can try some Physical therapy. Repeat Vit D and B12 in 3 months, lab only.

## 2018-09-10 NOTE — Telephone Encounter (Signed)
Pt called back to schedule B12 injections. Please advise on when to schedule 1st injection.

## 2018-09-10 NOTE — Telephone Encounter (Signed)
Nurse visit scheduled.

## 2018-09-17 ENCOUNTER — Ambulatory Visit (INDEPENDENT_AMBULATORY_CARE_PROVIDER_SITE_OTHER): Payer: Self-pay

## 2018-09-17 DIAGNOSIS — E538 Deficiency of other specified B group vitamins: Secondary | ICD-10-CM

## 2018-09-17 MED ORDER — CYANOCOBALAMIN 1000 MCG/ML IJ SOLN
1000.0000 ug | Freq: Once | INTRAMUSCULAR | Status: AC
  Administered 2018-09-17: 1000 ug via INTRAMUSCULAR

## 2018-09-17 NOTE — Progress Notes (Signed)
Pt given 1st B12 injection in Left Deltoid. Tolerated well.  Labwork states he needs to repeat B12 labs in 3 months. Wanted to clarify if he needs to get 2 monthly shots then bloodwork and wait to get the 3rd shot if necessary based on labs?

## 2018-10-22 ENCOUNTER — Ambulatory Visit (INDEPENDENT_AMBULATORY_CARE_PROVIDER_SITE_OTHER): Payer: Self-pay | Admitting: Emergency Medicine

## 2018-10-22 DIAGNOSIS — E538 Deficiency of other specified B group vitamins: Secondary | ICD-10-CM

## 2018-10-22 MED ORDER — CYANOCOBALAMIN 1000 MCG/ML IJ SOLN
1000.0000 ug | Freq: Once | INTRAMUSCULAR | Status: AC
Start: 2018-10-22 — End: 2018-10-22
  Administered 2018-10-22: 1000 ug via INTRAMUSCULAR

## 2018-10-22 NOTE — Progress Notes (Signed)
[facility] FNP, injection of B12 given by Zollie PeeSierra Rondon. Patient tolerated injection well. Patient received in right deltoid.

## 2018-11-27 ENCOUNTER — Ambulatory Visit: Payer: Medicaid Other

## 2019-01-16 IMAGING — DX DG CERVICAL SPINE COMPLETE 4+V
6 series · 6 of 6 positions shown · non-contrast
Comparison: No prior.

CLINICAL DATA: Paresthesias right upper extremity.

EXAM:
CERVICAL SPINE - COMPLETE 4+ VIEW

[c-spine lat]
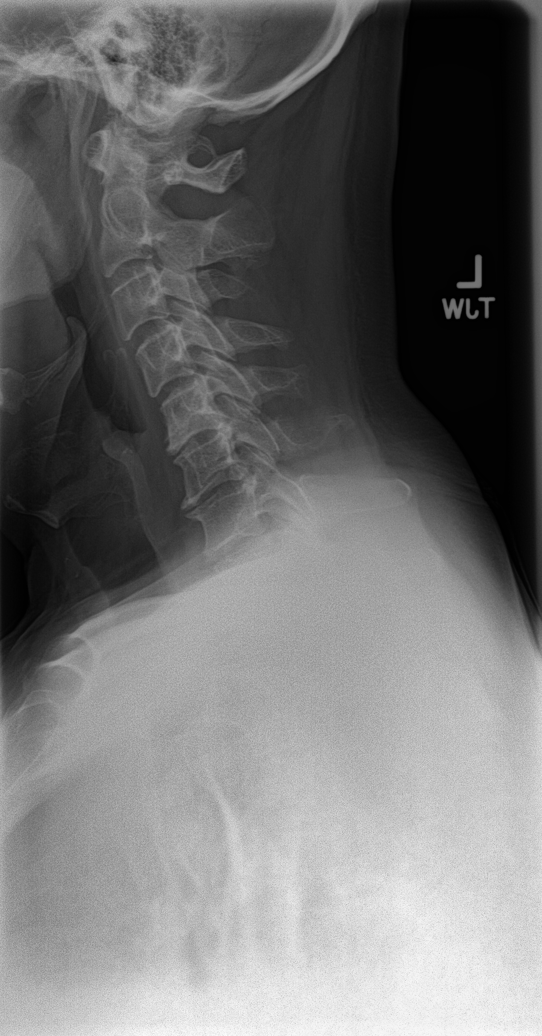

[c-spine obl (1 of 2)]
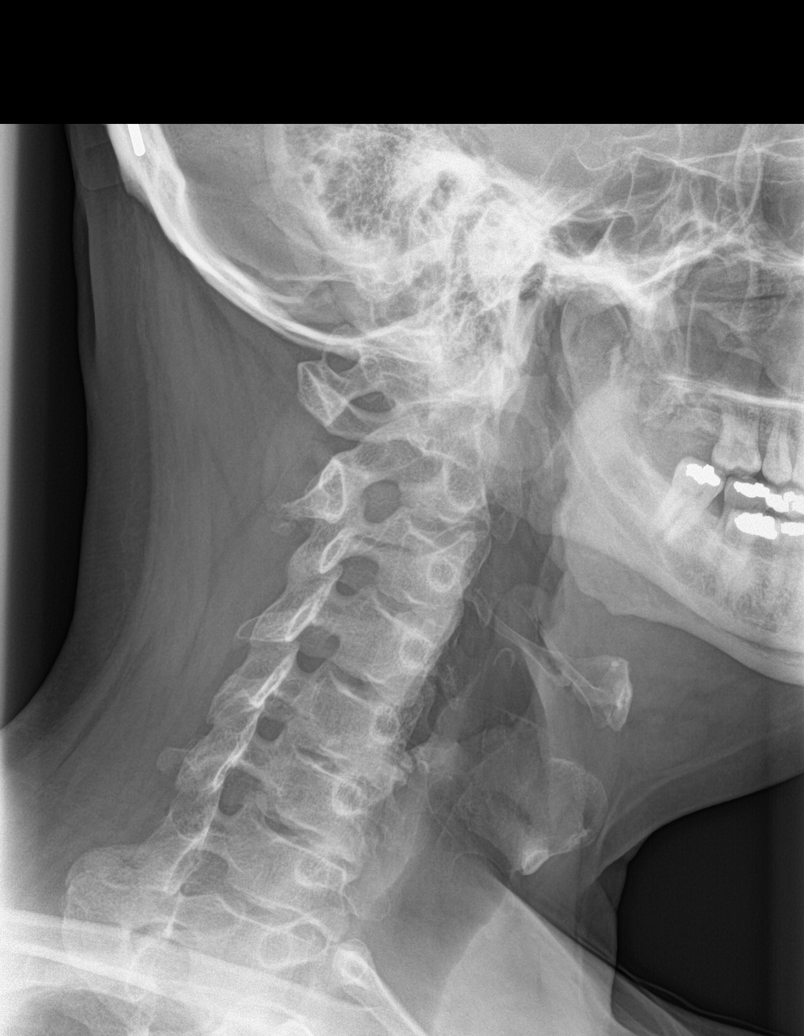

[c-spine obl (2 of 2)]
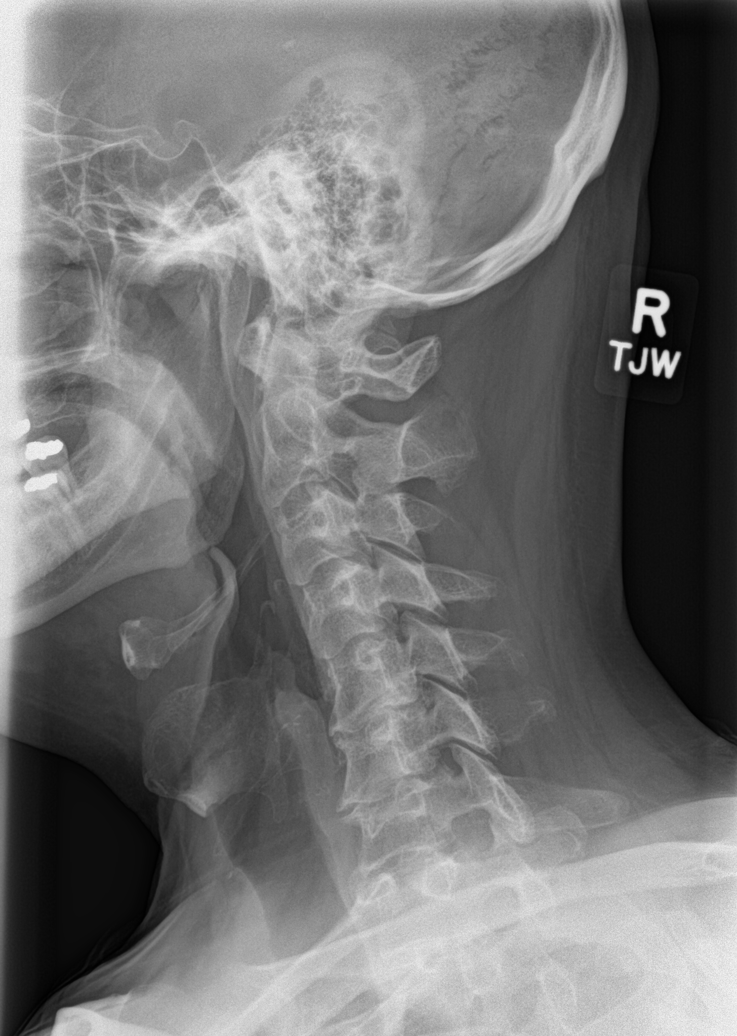

[c-spine ap]
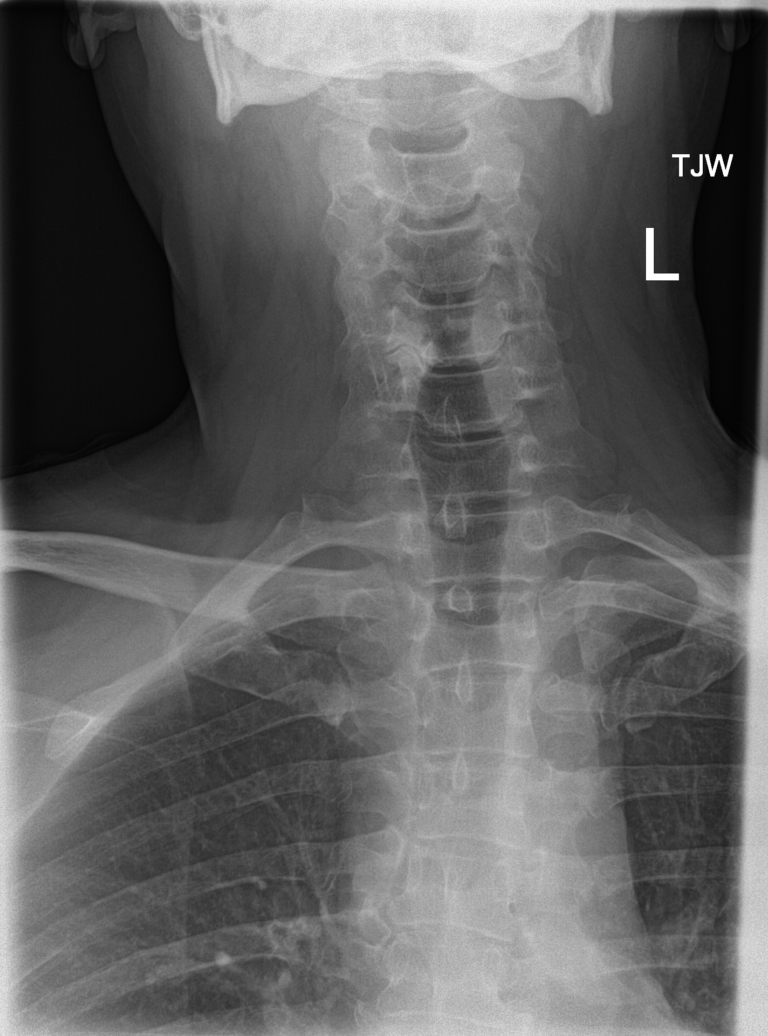

[c-spine open mouth]
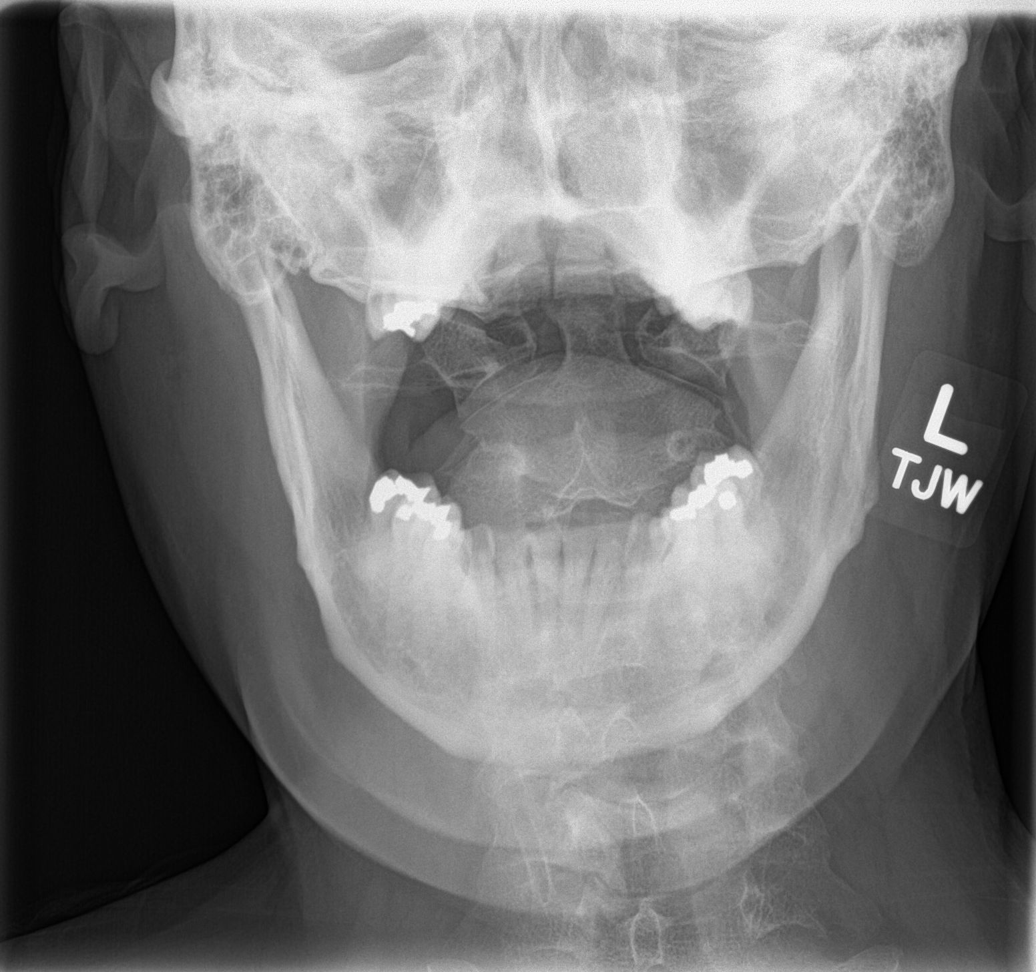

[c-spine swimmers]
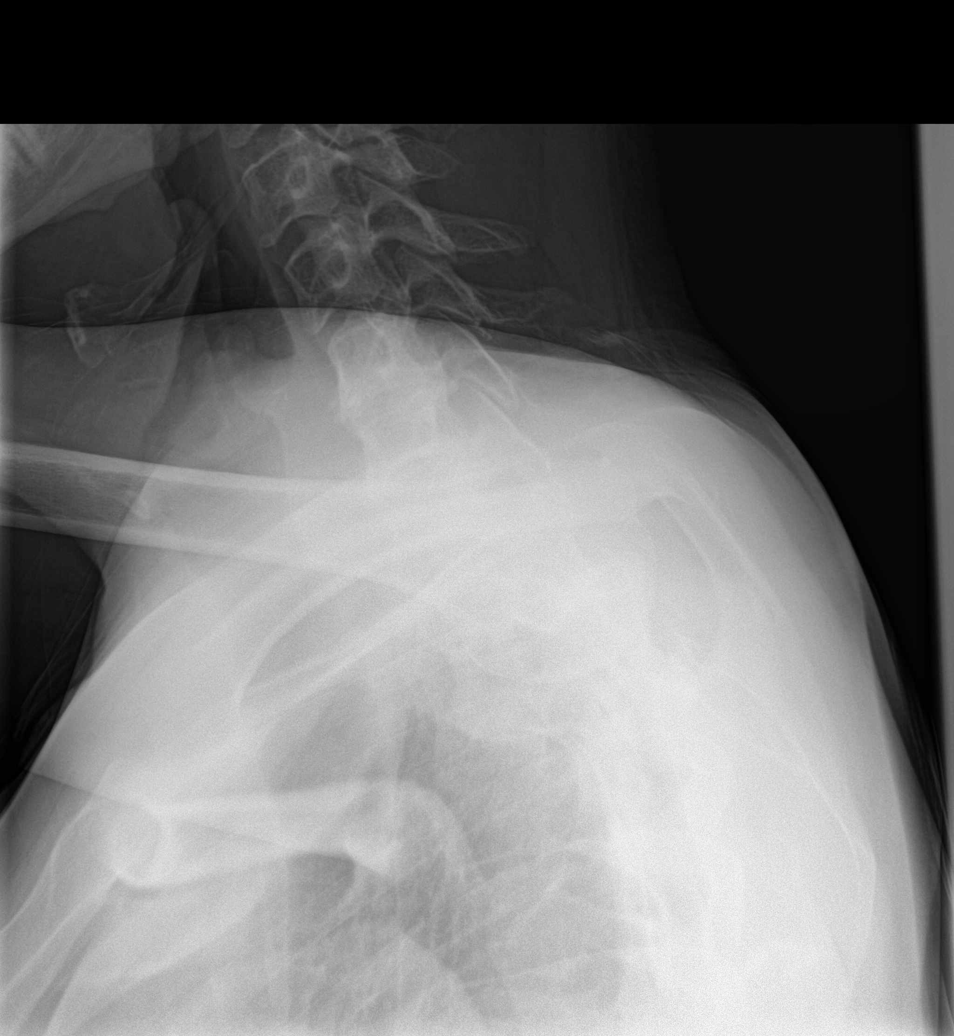

[6 of 6 positions shown; findings below may reference images not displayed]

FINDINGS: Diffuse multilevel degenerative change with loss of normal cervical
lordosis. C5-C6 and C6-C7 degenerative changes are particularly
prominent with disc degeneration with endplate osteophyte
formations. No acute bony abnormality.
IMPRESSION: Diffuse multilevel degenerative change with loss of normal cervical
lordosis. C5-C6 and C6-C7 degenerative changes are particularly
prominent. No acute bony abnormality.

## 2020-09-04 ENCOUNTER — Ambulatory Visit: Payer: Medicaid Other | Attending: Internal Medicine

## 2020-09-04 DIAGNOSIS — Z23 Encounter for immunization: Secondary | ICD-10-CM

## 2020-09-04 NOTE — Progress Notes (Signed)
   Covid-19 Vaccination Clinic  Name:  Tyler Leonard    MRN: [id number] DOB: 09/16/1971  09/04/2020  [name] was observed post Covid-19 immunization for 15 minutes without incident. He was provided with Vaccine Information Sheet and instruction to access the V-Safe system.   [name] was instructed to call 911 with any severe reactions post vaccine: Marland Kitchen Difficulty breathing  . Swelling of face and throat  . A fast heartbeat  . A bad rash all over body  . Dizziness and weakness

## 2023-11-16 ENCOUNTER — Telehealth (HOSPITAL_BASED_OUTPATIENT_CLINIC_OR_DEPARTMENT_OTHER): Payer: Self-pay

## 2023-11-16 ENCOUNTER — Ambulatory Visit (HOSPITAL_BASED_OUTPATIENT_CLINIC_OR_DEPARTMENT_OTHER)
Admission: EM | Admit: 2023-11-16 | Discharge: 2023-11-16 | Disposition: A | Discharge status: Home / Self Care | Payer: Self-pay | Attending: Family Medicine | Admitting: Family Medicine

## 2023-11-16 ENCOUNTER — Other Ambulatory Visit (HOSPITAL_BASED_OUTPATIENT_CLINIC_OR_DEPARTMENT_OTHER): Payer: Self-pay

## 2023-11-16 ENCOUNTER — Encounter (HOSPITAL_BASED_OUTPATIENT_CLINIC_OR_DEPARTMENT_OTHER): Payer: Self-pay | Admitting: Emergency Medicine

## 2023-11-16 DIAGNOSIS — B370 Candidal stomatitis: Secondary | ICD-10-CM | POA: Insufficient documentation

## 2023-11-16 DIAGNOSIS — R070 Pain in throat: Secondary | ICD-10-CM | POA: Insufficient documentation

## 2023-11-16 LAB — POCT RAPID STREP A (OFFICE): Rapid Strep A Screen: NEGATIVE

## 2023-11-16 MED ORDER — FLUCONAZOLE 100 MG PO TABS
100.0000 mg | ORAL_TABLET | Freq: Every day | ORAL | 0 refills | Status: AC
Start: 2023-11-16 — End: 2023-11-26
  Filled 2023-11-16: qty 10, 10d supply, fill #0

## 2023-11-16 MED ORDER — FLUCONAZOLE 100 MG PO TABS: 100.0000 mg | ORAL_TABLET | Freq: Every day | ORAL | 0 refills | Status: DC

## 2023-11-16 NOTE — Telephone Encounter (Signed)
 Pharmacy changed per pt request

## 2023-11-16 NOTE — ED Provider Notes (Signed)
 [facility]    CSN: [id number] Arrival date & time: 11/16/23  1251      History   Chief Complaint Chief Complaint  Patient presents with   Sore Throat    HPI Tyler Leonard is a 53 y.o. male.   Patient reports sore throat intermittently for over 2 weeks.  He has not felt like he had a fever during that time.  He is not aware of exposure to strep throat.  He is not aware of exposure to the flu or COVID or other viral related illnesses.  He feels like yesterday he saw some white patches in his mouth and worries now that he may have thrush.   Sore Throat Pertinent negatives include no chest pain, no abdominal pain and no shortness of breath.    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 No pertinent surgical history.     Home Medications    Prior to Admission medications   Medication Sig Start Date End Date Taking? Authorizing Provider  aspirin 81 MG chewable tablet Place 1 Tablet into mouth, chew and swallow once daily 07/15/23  Yes [provider]  BIKTARVY 50-200-25 MG TABS tablet Take 1 tablet by mouth daily.  08/05/18  Yes [provider]  fluconazole  (DIFLUCAN ) 100 MG tablet Take 1 tablet (100 mg total) by mouth daily for 10 days. 11/16/23 11/26/23 Yes Ival Domino, FNP  Testosterone  Enanthate (XYOSTED ) 75 MG/0.5ML SOAJ Inject into the skin. 10/11/23  Yes [provider]  venlafaxine XR (EFFEXOR-XR) 75 MG 24 hr capsule Take 1 capsule by mouth daily with breakfast. 07/15/23  Yes [provider]  ARIPiprazole (ABILIFY) 20 MG tablet Take 20 mg by mouth daily.    [provider]  omega-3 acid ethyl esters (LOVAZA) 1 g capsule Take 2 capsules by mouth 2 (two) times daily.   Yes [provider]    Family History Family History  Problem Relation Age of Onset   Alcohol abuse Mother    Drug abuse Mother    Cancer Mother        uterine   Hyperlipidemia Mother    Hypertension Mother    Mental illness Mother    Heart disease Maternal Grandmother    Arthritis Paternal Grandmother    Hypertension Paternal Grandmother     Social History Social History   Tobacco Use   Smoking status: Former    Current packs/day: 0.00    Average packs/day: 0.5 packs/day for 25.0 years (12.5 ttl pk-yrs)    Types: Cigarettes    Start date: 12/13/1987    Quit date: 12/12/2012    Years since quitting: 10.9   Smokeless tobacco: Never  Substance Use Topics   Alcohol use: Yes    Alcohol/week: 8.0 standard drinks of alcohol    Types: 8 Standard drinks or equivalent per week    Comment: rare beer drinker    Drug use: No     Allergies   Sulfa antibiotics   Review of Systems Review of Systems  Constitutional:  Negative for chills and fever.  HENT:  Negative for ear pain. Sore throat:  With white plaque on his tongue.  Eyes:  Negative for pain and visual disturbance.  Respiratory:  Negative for cough and shortness of breath.   Cardiovascular:  Negative for chest pain and palpitations.  Gastrointestinal:  Negative for abdominal pain and vomiting.  Genitourinary:  Negative for dysuria and hematuria.  Musculoskeletal:  Negative for arthralgias and back pain.  Skin:  Negative for color change and rash.  Neurological:  Negative for seizures and syncope.  All other systems reviewed and are negative.    Physical Exam Triage Vital Signs ED Triage Vitals  Encounter Vitals Group     BP 11/16/23 1303 (!) 171/78     Systolic BP Percentile --      Diastolic BP Percentile --      Pulse Rate 11/16/23 1303 79     Resp 11/16/23 1303 18     Temp 11/16/23 1303  97.9 F (36.6 C)     Temp Source 11/16/23 1303 Oral     SpO2 11/16/23 1303 97 %     Weight --      Height --      Head Circumference --      Peak Flow --      Pain Score 11/16/23 1300 4     Pain Loc --      Pain Education --      Exclude from Growth Chart --    No data found.  Updated Vital Signs BP (!) 171/78 (BP Location: Right Arm)   Pulse 79   Temp 97.9 F (36.6 C) (Oral)   Resp 18   SpO2 97%   Visual Acuity Right Eye Distance:   Left Eye Distance:   Bilateral Distance:    Right Eye Near:   Left Eye Near:    Bilateral Near:     Physical Exam Vitals and nursing note reviewed.  Constitutional:      General: He is not in acute distress.    Appearance: He is well-developed.  HENT:     Head: Normocephalic and atraumatic.     Right Ear: Hearing, tympanic membrane, ear canal and external ear normal.     Left Ear: Hearing, tympanic membrane, ear canal and external ear normal.     Nose: Nose normal.     Mouth/Throat:     Lips: Pink.     Mouth: Mucous membranes are moist. No oral lesions.     Tongue: No lesions (No plaque noted today.  Tongue and oral cavity is very inflamed.).     Pharynx: Uvula midline. Posterior oropharyngeal erythema present. No oropharyngeal exudate.     Tonsils: No tonsillar exudate.  Eyes:     Conjunctiva/sclera: Conjunctivae normal.     Pupils: Pupils are equal, round, and reactive to light.  Cardiovascular:     Rate and Rhythm: Normal rate and regular rhythm.     Heart sounds: Normal heart sounds, S1 normal and S2 normal. No murmur heard. Pulmonary:     Effort: Pulmonary effort is normal. No respiratory distress.     Breath sounds: Normal breath sounds.  Abdominal:     Palpations: Abdomen is soft.     Tenderness: There is no abdominal tenderness.  Musculoskeletal:        General: No swelling.     Cervical back: Neck supple.  Lymphadenopathy:     Head:     Right side of head: No submental, submandibular, tonsillar, preauricular or  posterior auricular adenopathy.     Left side of head: No  submental, submandibular, tonsillar, preauricular or posterior auricular adenopathy.     Cervical: No cervical adenopathy.  Skin:    General: Skin is warm and dry.     Capillary Refill: Capillary refill takes less than 2 seconds.     Findings: No rash.  Neurological:     Mental Status: He is alert and oriented to person, place, and time.  Psychiatric:        Mood and Affect: Mood normal.      UC Treatments / Results  Labs (all labs ordered are listed, but only abnormal results are displayed) Labs Reviewed  POCT RAPID STREP A (OFFICE) - Normal  CULTURE, GROUP A STREP [facility])    EKG   Radiology No results found.  Procedures Procedures (including critical care time)  Medications Ordered in UC Medications - No data to display  Initial Impression / Assessment and Plan / UC Course  I have reviewed the triage vital signs and the nursing notes.  Pertinent labs & imaging results that were available during my care of the patient were reviewed by me and considered in my medical decision making (see chart for details).  Exam showed very erythematous mouth throat and uvula but no exudate.  Rapid strep was negative.  Throat culture sent.  Will adjust the plan of care, as needed, once the throat culture results.  Advised that if his culture is negative it will be available for review on the portal.  If it is positive he will be called and an antibiotic would be provided, if indicated.  Educated about methods to help with throat pain including over-the-counter ibuprofen or acetaminophen, as directed on the package, every 4 hours, as needed for fever or throat pain.  Thrush: No plaque noted today but patient reports that he saw some yesterday.  He does have HIV and is at risk for oral thrush.  Educated about thrush with handout given and comfort measures.  Fluconazole  100 mg, 1, daily, 10 days.  Follow-up here if symptoms do not  resolve, worsen, or new symptoms occur. Final Clinical Impressions(s) / UC Diagnoses   Final diagnoses:  Throat pain  Thrush, oral     Discharge Instructions      Exam shows an inflamed throat.  Rapid strep is negative.  Throat culture sent and will be available on Sunday or Monday.  Encouraged fluconazole , 100 mg, #1 daily, for 10 days for oral thrush.  May take acetaminophen or ibuprofen, over-the-counter, as directed on the package, every 4 hours, as needed for fever or throat pain.  Recheck here if symptoms do not resolve, worsen or new symptoms occur.    ED Prescriptions     Medication Sig Dispense Auth. Provider   fluconazole  (DIFLUCAN ) 100 MG tablet Take 1 tablet (100 mg total) by mouth daily for 10 days. 10 tablet Deziree Mokry, FNP      PDMP not reviewed this encounter.   Ival Domino, FNP 11/16/23 1432

## 2023-11-16 NOTE — Discharge Instructions (Signed)
 Exam shows an inflamed throat.  Rapid strep is negative.  Throat culture sent and will be available on Sunday or Monday.  Encouraged fluconazole , 100 mg, #1 daily, for 10 days for oral thrush.  May take acetaminophen or ibuprofen, over-the-counter, as directed on the package, every 4 hours, as needed for fever or throat pain.  Recheck here if symptoms do not resolve, worsen or new symptoms occur.

## 2023-11-16 NOTE — ED Triage Notes (Signed)
 Pt thinks he has oral thrush x 2 weeks. Pt does have a sore throat.

## 2023-11-20 LAB — CULTURE, GROUP A STREP (THRC)

## 2023-11-21 NOTE — Progress Notes (Signed)
 Culture is negative.  The patient is immuno-compromised and had Thrush.  I was put on antibiotic for tonsillitis and fluconazole  for thrush.  I was not able to reach him but I was able to leave a VM message.  I updated him on the negative throat culture result.  I encouraged him to stop the antibiotic, as it would only worsen the Thrush.  I encouraged him to call back, if any questions.

## 2024-02-20 ENCOUNTER — Other Ambulatory Visit: Payer: Self-pay | Admitting: Medical Genetics

## 2024-02-22 ENCOUNTER — Other Ambulatory Visit
Admission: RE | Admit: 2024-02-22 | Discharge: 2024-02-22 | Disposition: A | Discharge status: Home / Self Care | Source: Ambulatory Visit | Attending: Medical Genetics | Admitting: Medical Genetics

## 2024-03-04 LAB — GENECONNECT MOLECULAR SCREEN: Genetic Analysis Overall Interpretation: NEGATIVE

## 2024-07-03 ENCOUNTER — Other Ambulatory Visit (HOSPITAL_BASED_OUTPATIENT_CLINIC_OR_DEPARTMENT_OTHER): Payer: Self-pay

## 2024-07-03 ENCOUNTER — Ambulatory Visit (HOSPITAL_BASED_OUTPATIENT_CLINIC_OR_DEPARTMENT_OTHER): Admission: EM | Admit: 2024-07-03 | Discharge: 2024-07-03 | Disposition: A | Discharge status: Home / Self Care

## 2024-07-03 ENCOUNTER — Encounter (HOSPITAL_BASED_OUTPATIENT_CLINIC_OR_DEPARTMENT_OTHER): Payer: Self-pay

## 2024-07-03 DIAGNOSIS — J0141 Acute recurrent pansinusitis: Secondary | ICD-10-CM

## 2024-07-03 DIAGNOSIS — H9203 Otalgia, bilateral: Secondary | ICD-10-CM

## 2024-07-03 MED ORDER — DOXYCYCLINE HYCLATE 100 MG PO CAPS
100.0000 mg | ORAL_CAPSULE | Freq: Two times a day (BID) | ORAL | 0 refills | Status: AC
  Filled 2024-07-03: qty 20, 10d supply, fill #0

## 2024-07-03 MED ORDER — PREDNISONE 20 MG PO TABS
ORAL_TABLET | ORAL | 0 refills | Status: AC
  Filled 2024-07-03: qty 9, 6d supply, fill #0

## 2024-07-03 NOTE — ED Provider Notes (Signed)
 [facility]    CSN: [id number] Arrival date & time: 07/03/24  1344      History   Chief Complaint Chief Complaint  Patient presents with   Ear Fullness    HPI Tyler Leonard is a 53 y.o. male.   53 year old male with bilateral ear fullness and pain.  Symptoms have been persistent since 06/26/2024 or earlier.  He has had some intermittent dizziness that mostly relates to head movement or change of position from lying to sitting or sitting to standing.  He has a history of chronic sinus infections and chronic ear infections.  He has had some sinus surgery in the past.  He was told he needed tubes in both ears but did not have insurance and was unable to afford it.  He is reporting chronic intermittent headaches.  He believes he has a sinus infection and ear infection right now.  He tried sinus rinses and it made his symptoms worse.   Ear Fullness Associated symptoms include headaches. Pertinent negatives include no chest pain and no abdominal pain.    Past Medical History:  Diagnosis Date   Allergy    HIV infection [facility])     Patient Active Problem List   Diagnosis Date Noted   Abdominal wall cellulitis 02/27/2017   Hypogonadism male 10/20/2014   Hair loss 10/15/2014   Insomnia 10/15/2014   Allergic rhinitis 08/27/2014   HIV (human immunodeficiency virus infection) (HCC) 07/15/2012   Erectile dysfunction 06/25/2012    History reviewed. No pertinent surgical history.     Home Medications    Prior to Admission medications   Medication Sig Start Date End Date Taking? Authorizing Provider  doxycycline  (VIBRAMYCIN ) 100 MG capsule Take 1 capsule (100 mg total) by mouth 2 (two) times daily for 10 days. 07/03/24 07/13/24 Yes Ival Domino, FNP  lisinopril (ZESTRIL) 10 MG tablet Take 10 mg by mouth daily. 02/05/24  Yes [provider]  predniSONE  (DELTASONE ) 20 MG tablet Take 2 tablets (40 mg total) by mouth daily for 3 days, THEN 1 tablet (20 mg total) daily  for 3 days. 07/03/24 07/09/24 Yes Ival Domino, FNP  aspirin 81 MG chewable tablet Place 1 Tablet into mouth, chew and swallow once daily 07/15/23   [provider]  BIKTARVY 50-200-25 MG TABS tablet Take 1 tablet by mouth daily.  08/05/18   [provider]  omega-3 acid ethyl esters (LOVAZA) 1 g capsule Take 2 capsules by mouth 2 (two) times daily.    [provider]  Testosterone  Enanthate (XYOSTED ) 75 MG/0.5ML SOAJ Inject into the skin. 10/11/23   [provider]    Family History Family History  Problem Relation Age of Onset   Alcohol abuse Mother    Drug abuse Mother    Cancer Mother        uterine   Hyperlipidemia Mother    Hypertension Mother    Mental illness Mother    Heart disease Maternal Grandmother    Arthritis Paternal Grandmother    Hypertension Paternal Grandmother     Social History Social History   Tobacco Use   Smoking status: Former    Current packs/day: 0.00    Average packs/day: 0.5 packs/day for 25.0 years (12.5 ttl pk-yrs)    Types: Cigarettes    Start date: 12/13/1987    Quit date: 12/12/2012    Years since quitting: 11.5   Smokeless tobacco: Never  Vaping Use   Vaping status: Never Used  Substance Use Topics  Alcohol use: Yes    Alcohol/week: 8.0 standard drinks of alcohol    Types: 8 Standard drinks or equivalent per week    Comment: rare beer drinker    Drug use: No     Allergies   Sulfa antibiotics   Review of Systems Review of Systems  Constitutional:  Negative for chills and fever.  HENT:  Positive for congestion, ear pain, postnasal drip, rhinorrhea, sinus pressure and sinus pain. Negative for sore throat.   Eyes:  Negative for pain and visual disturbance.  Respiratory:  Negative for cough.   Cardiovascular:  Negative for chest pain and palpitations.  Gastrointestinal:  Negative for abdominal pain, constipation, diarrhea, nausea and vomiting.  Genitourinary:  Negative for dysuria and hematuria.   Musculoskeletal:  Negative for arthralgias and back pain.  Skin:  Negative for color change and rash.  Neurological:  Positive for dizziness and headaches. Negative for seizures and syncope.  All other systems reviewed and are negative.    Physical Exam Triage Vital Signs ED Triage Vitals  Encounter Vitals Group     BP 07/03/24 1408 (!) 143/82     Girls Systolic BP Percentile --      Girls Diastolic BP Percentile --      Boys Systolic BP Percentile --      Boys Diastolic BP Percentile --      Pulse Rate 07/03/24 1408 70     Resp 07/03/24 1408 20     Temp 07/03/24 1408 98.2 F (36.8 C)     Temp Source 07/03/24 1408 Oral     SpO2 07/03/24 1408 97 %     Weight --      Height --      Head Circumference --      Peak Flow --      Pain Score 07/03/24 1406 6     Pain Loc --      Pain Education --      Exclude from Growth Chart --    No data found.  Updated Vital Signs BP (!) 143/82 (BP Location: Right Arm)   Pulse 70   Temp 98.2 F (36.8 C) (Oral)   Resp 20   SpO2 97%   Visual Acuity Right Eye Distance:   Left Eye Distance:   Bilateral Distance:    Right Eye Near:   Left Eye Near:    Bilateral Near:     Physical Exam Vitals and nursing note reviewed.  Constitutional:      General: He is not in acute distress.    Appearance: He is well-developed. He is ill-appearing. He is not toxic-appearing or diaphoretic.  HENT:     Head: Normocephalic and atraumatic.     Right Ear: Hearing and external ear normal.     Left Ear: Hearing and external ear normal.     Ears:     Comments: It is very hard to assess the ears.  The ears are scarred to a thick opacity and not translucent.  They do not appear to be bulging but there is no cone of light.  No purulent nor serous fluid detected behind the tympanic membranes but again they are opaque.  Parts of the tympanic membranes are erythematous.  No pain with movement of the pinna or auricle.  No swelling or edema of the canals.     Nose: Congestion and rhinorrhea present. Rhinorrhea is clear.     Right Sinus: Maxillary sinus tenderness and frontal sinus tenderness present.  Left Sinus: Maxillary sinus tenderness and frontal sinus tenderness present.     Mouth/Throat:     Lips: Pink.     Mouth: Mucous membranes are moist.     Pharynx: Uvula midline. No oropharyngeal exudate or posterior oropharyngeal erythema.     Tonsils: No tonsillar exudate.  Eyes:     Conjunctiva/sclera: Conjunctivae normal.     Pupils: Pupils are equal, round, and reactive to light.  Cardiovascular:     Rate and Rhythm: Normal rate and regular rhythm.     Heart sounds: S1 normal and S2 normal. No murmur heard. Pulmonary:     Effort: Pulmonary effort is normal. No respiratory distress.     Breath sounds: Normal breath sounds. No decreased breath sounds, wheezing, rhonchi or rales.  Abdominal:     General: Bowel sounds are normal.     Palpations: Abdomen is soft.     Tenderness: There is no abdominal tenderness.  Musculoskeletal:        General: No swelling.     Cervical back: Neck supple.  Lymphadenopathy:     Head:     Right side of head: No submental, submandibular, tonsillar, preauricular or posterior auricular adenopathy.     Left side of head: No submental, submandibular, tonsillar, preauricular or posterior auricular adenopathy.     Cervical: No cervical adenopathy.     Right cervical: No superficial cervical adenopathy.    Left cervical: No superficial cervical adenopathy.  Skin:    General: Skin is warm and dry.     Capillary Refill: Capillary refill takes less than 2 seconds.     Findings: No rash.  Neurological:     Mental Status: He is alert and oriented to person, place, and time.  Psychiatric:        Mood and Affect: Mood normal.      UC Treatments / Results  Labs (all labs ordered are listed, but only abnormal results are displayed) Labs Reviewed - No data to display  EKG   Radiology No results  found.  Procedures Procedures (including critical care time)  Medications Ordered in UC Medications - No data to display  Initial Impression / Assessment and Plan / UC Course  I have reviewed the triage vital signs and the nursing notes.  Pertinent labs & imaging results that were available during my care of the patient were reviewed by me and considered in my medical decision making (see chart for details).  Plan of Care: Chronic sinusitis and otalgia: Will treat for sinusitis.  Doxycycline  100 mg twice daily for 10 days.  Prednisone  20 mg, 2 pills daily for 3 days then 20 mg, 1 pill daily for 3 days.  Encouraged sinus rinses once he is starting to feel better with the sinus pressure.  Get plenty of fluids and rest.  Encouraged to see ENT for further management of ears and sinuses.  I reviewed the plan of care with the patient and/or the patient's guardian.  The patient and/or guardian had time to ask questions and acknowledged that the questions were answered.  I provided instruction on symptoms or reasons to return here or to go to an ER, if symptoms/condition did not improve, worsened or if new symptoms occurred.  Final Clinical Impressions(s) / UC Diagnoses   Final diagnoses:  Acute recurrent pansinusitis  Otalgia of both ears     Discharge Instructions      Sinusitis with bilateral ear pain: Unable to confirm ear infections due to scarring of tympanic membranes/eardrums.  Will treat for sinusitis.  Encouraged sinus rinses once he is feeling better and done twice daily.  Doxycycline  100 mg twice daily for 10 days.  Prednisone  20 mg, 2 pills daily for 3 days then 20 mg, 1 pill daily for 3 days.  Get plenty of fluids and rest.  Follow-up if symptoms do not improve, worsen or new symptoms occur.  Encouraged to see ENT for further evaluation of ears.      ED Prescriptions     Medication Sig Dispense Auth. Provider   predniSONE  (DELTASONE ) 20 MG tablet Take 2 tablets (40 mg  total) by mouth daily for 3 days, THEN 1 tablet (20 mg total) daily for 3 days. 9 tablet Darry Kelnhofer, FNP   doxycycline  (VIBRAMYCIN ) 100 MG capsule Take 1 capsule (100 mg total) by mouth 2 (two) times daily for 10 days. 20 capsule Ival Domino, FNP      PDMP not reviewed this encounter.   Ival Domino, FNP 07/03/24 [phone]

## 2024-07-03 NOTE — Discharge Instructions (Addendum)
 Sinusitis with bilateral ear pain: Unable to confirm ear infections due to scarring of tympanic membranes/eardrums.  Will treat for sinusitis.  Encouraged sinus rinses once he is feeling better and done twice daily.  Doxycycline  100 mg twice daily for 10 days.  Prednisone  20 mg, 2 pills daily for 3 days then 20 mg, 1 pill daily for 3 days.  Get plenty of fluids and rest.  Follow-up if symptoms do not improve, worsen or new symptoms occur.  Encouraged to see ENT for further evaluation of ears.

## 2024-07-03 NOTE — ED Triage Notes (Signed)
 Pt c/o bilateral ear fullness and dizziness for the last week. Dizziness comes and goes and is mostly when he changes positions. He has hx of chronic sinus infections and he is not sure if he is getting an ear infection. He is also having HA. Pt has not taken anything for his symptoms.
